# Patient Record
Sex: Male | Born: 1935 | Race: White | Hispanic: No | Smoking: Former smoker
Health system: Southern US, Community
[De-identification: ages and names within clinical notes are randomized; demographics above are authoritative.]

## PROBLEM LIST (undated history)

## (undated) DIAGNOSIS — E119 Type 2 diabetes mellitus without complications: Secondary | ICD-10-CM

## (undated) DIAGNOSIS — Z9289 Personal history of other medical treatment: Secondary | ICD-10-CM

## (undated) DIAGNOSIS — Z8719 Personal history of other diseases of the digestive system: Secondary | ICD-10-CM

## (undated) DIAGNOSIS — I2510 Atherosclerotic heart disease of native coronary artery without angina pectoris: Secondary | ICD-10-CM

## (undated) DIAGNOSIS — R011 Cardiac murmur, unspecified: Secondary | ICD-10-CM

## (undated) DIAGNOSIS — M545 Low back pain, unspecified: Secondary | ICD-10-CM

## (undated) DIAGNOSIS — M1990 Unspecified osteoarthritis, unspecified site: Secondary | ICD-10-CM

## (undated) DIAGNOSIS — J329 Chronic sinusitis, unspecified: Secondary | ICD-10-CM

## (undated) DIAGNOSIS — G4730 Sleep apnea, unspecified: Secondary | ICD-10-CM

## (undated) DIAGNOSIS — I10 Essential (primary) hypertension: Secondary | ICD-10-CM

## (undated) DIAGNOSIS — G8929 Other chronic pain: Secondary | ICD-10-CM

## (undated) DIAGNOSIS — N400 Enlarged prostate without lower urinary tract symptoms: Secondary | ICD-10-CM

## (undated) DIAGNOSIS — I2109 ST elevation (STEMI) myocardial infarction involving other coronary artery of anterior wall: Secondary | ICD-10-CM

## (undated) DIAGNOSIS — E785 Hyperlipidemia, unspecified: Secondary | ICD-10-CM

## (undated) DIAGNOSIS — I6529 Occlusion and stenosis of unspecified carotid artery: Secondary | ICD-10-CM

## (undated) HISTORY — DX: Atherosclerotic heart disease of native coronary artery without angina pectoris: I25.10

## (undated) HISTORY — DX: Chronic sinusitis, unspecified: J32.9

## (undated) HISTORY — PX: SKIN CANCER EXCISION: SHX779

## (undated) HISTORY — DX: ST elevation (STEMI) myocardial infarction involving other coronary artery of anterior wall: I21.09

## (undated) HISTORY — DX: Benign prostatic hyperplasia without lower urinary tract symptoms: N40.0

## (undated) HISTORY — DX: Essential (primary) hypertension: I10

## (undated) HISTORY — DX: Hyperlipidemia, unspecified: E78.5

---
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
## 1969-05-18 HISTORY — PX: CATARACT EXTRACTION, BILATERAL: SHX1313

## 1979-05-19 HISTORY — PX: CARPAL TUNNEL RELEASE: SHX101

## 1989-05-18 HISTORY — PX: TRANSURETHRAL RESECTION OF PROSTATE: SHX73

## 2000-09-17 DIAGNOSIS — I2109 ST elevation (STEMI) myocardial infarction involving other coronary artery of anterior wall: Secondary | ICD-10-CM

## 2000-09-17 HISTORY — DX: ST elevation (STEMI) myocardial infarction involving other coronary artery of anterior wall: I21.09

## 2000-09-17 HISTORY — PX: CORONARY ARTERY BYPASS GRAFT: SHX141

## 2001-09-04 ENCOUNTER — Inpatient Hospital Stay (HOSPITAL_COMMUNITY): Admission: AD | Admit: 2001-09-04 | Discharge: 2001-09-16 | Payer: Self-pay | Admitting: *Deleted

## 2001-09-08 HISTORY — PX: CARDIAC CATHETERIZATION: SHX172

## 2001-09-09 ENCOUNTER — Encounter: Payer: Self-pay | Admitting: Cardiology

## 2001-09-09 ENCOUNTER — Encounter: Payer: Self-pay | Admitting: Cardiothoracic Surgery

## 2001-09-10 ENCOUNTER — Encounter: Payer: Self-pay | Admitting: Cardiology

## 2001-09-11 ENCOUNTER — Encounter: Payer: Self-pay | Admitting: Cardiothoracic Surgery

## 2001-09-12 ENCOUNTER — Encounter: Payer: Self-pay | Admitting: Cardiothoracic Surgery

## 2001-09-13 ENCOUNTER — Encounter: Payer: Self-pay | Admitting: Cardiothoracic Surgery

## 2001-09-14 ENCOUNTER — Encounter: Payer: Self-pay | Admitting: Cardiothoracic Surgery

## 2008-10-18 HISTORY — PX: TRANSURETHRAL PROSTATECTOMY WITH GYRUS INSTRUMENTS: SHX6153

## 2008-10-19 ENCOUNTER — Ambulatory Visit (HOSPITAL_COMMUNITY): Admission: RE | Admit: 2008-10-19 | Discharge: 2008-10-20 | Payer: Self-pay | Admitting: Urology

## 2010-08-23 ENCOUNTER — Encounter
Admission: RE | Admit: 2010-08-23 | Discharge: 2010-08-23 | Payer: Self-pay | Source: Home / Self Care | Admitting: Cardiology

## 2010-08-23 ENCOUNTER — Ambulatory Visit: Payer: Self-pay | Admitting: Cardiology

## 2010-08-28 ENCOUNTER — Ambulatory Visit (HOSPITAL_COMMUNITY)
Admission: RE | Admit: 2010-08-28 | Discharge: 2010-08-29 | Payer: Self-pay | Source: Home / Self Care | Attending: Cardiovascular Disease | Admitting: Cardiovascular Disease

## 2010-08-28 HISTORY — PX: CORONARY ANGIOPLASTY WITH STENT PLACEMENT: SHX49

## 2010-09-05 ENCOUNTER — Ambulatory Visit: Payer: Self-pay | Admitting: Cardiology

## 2010-10-17 ENCOUNTER — Ambulatory Visit: Payer: Self-pay | Admitting: Cardiology

## 2010-11-27 LAB — CBC
MCH: 30.5 pg (ref 26.0–34.0)
MCHC: 34.2 g/dL (ref 30.0–36.0)
Platelets: 181 10*3/uL (ref 150–400)

## 2010-11-27 LAB — GLUCOSE, CAPILLARY
Glucose-Capillary: 106 mg/dL — ABNORMAL HIGH (ref 70–99)
Glucose-Capillary: 107 mg/dL — ABNORMAL HIGH (ref 70–99)
Glucose-Capillary: 150 mg/dL — ABNORMAL HIGH (ref 70–99)
Glucose-Capillary: 179 mg/dL — ABNORMAL HIGH (ref 70–99)

## 2010-11-27 LAB — BASIC METABOLIC PANEL
BUN: 14 mg/dL (ref 6–23)
CO2: 28 mEq/L (ref 19–32)
Calcium: 9.4 mg/dL (ref 8.4–10.5)
Creatinine, Ser: 0.55 mg/dL (ref 0.4–1.5)
Glucose, Bld: 130 mg/dL — ABNORMAL HIGH (ref 70–99)

## 2011-01-01 LAB — BASIC METABOLIC PANEL
BUN: 12 mg/dL (ref 6–23)
CO2: 28 mEq/L (ref 19–32)
Calcium: 9.5 mg/dL (ref 8.4–10.5)
GFR calc non Af Amer: >60 mL/min (ref >60)
Glucose, Bld: 138 mg/dL — ABNORMAL HIGH (ref 70–99)

## 2011-01-02 LAB — GLUCOSE, CAPILLARY
Glucose-Capillary: 123 mg/dL — ABNORMAL HIGH (ref 70–99)
Glucose-Capillary: 130 mg/dL — ABNORMAL HIGH (ref 70–99)
Glucose-Capillary: 150 mg/dL — ABNORMAL HIGH (ref 70–99)
Glucose-Capillary: 191 mg/dL — ABNORMAL HIGH (ref 70–99)

## 2011-01-30 NOTE — Op Note (Signed)
NAME:  Tommy Patterson, Tommy Patterson NO.:  [id number]   MEDICAL RECORD NO.:  [id number]          PATIENT TYPE:  AMB   LOCATION:  DAY                          FACILITY:  [facility]   PHYSICIAN:  Excell Seltzer. Annabell Howells, M.D.    DATE OF BIRTH:  04-25-1924   DATE OF PROCEDURE:  10/19/2008  DATE OF DISCHARGE:                               OPERATIVE REPORT   PROCEDURE:  Gyrus transurethral resection of the prostate.   PREOPERATIVE DIAGNOSIS:  Benign prostatic hypertrophy and bladder  obstruction.   POSTOPERATIVE DIAGNOSIS:  Benign prostatic hypertrophy and bladder  obstruction.   SURGEON:  Dr. Bjorn Pippin.   ANESTHESIA:  Spinal.   DRAINS:  23-French three-way Foley catheter.   COMPLICATIONS:  Bleeding that obscured visualization, requiring early  cessation of the procedure.   INDICATIONS:  Tommy Patterson is an 77 year old white male with BPH and  bladder obstruction with primarily irritative symptoms, but urodynamic  evidence of obstruction.  He had failed anticholinergic therapy it was  felt that TURP was indicated to relieve his outlet obstruction and  hopefully reduce his irritative symptoms.   FINDINGS/PROCEDURE:  The patient was given Cipro.  He was taken to the  operating room and spinal anesthetic was induced.  He was placed in  lithotomy position.  His perineum and genitalia were prepped with  Betadine solution.  He was draped in the usual sterile fashion.   Cystoscopy was performed using a 22-French scope and 12 and 70-degree  lenses.  Examination revealed a normal urethra.  The external sphincter  was intact.  Prostatic urethra was short with a tight bladder neck and  bilobar hyperplasia with some obstruction.  Examination of bladder  revealed mild to moderate trabeculation.  No tumors or stones were  noted.  Ureteral orifices were in their normal anatomic position.  The  left was slightly U-shaped, the right was unremarkable.   After thorough cystoscopy, the urethra was  calibrated to 32-French with  Sissy Hoff sounds and a 28-French continuous-flow resectoscope sheath was  inserted.  This was fitted with Wandra Scot handle, a Gyrus button  electrode and 12-degree lens.  Normal saline was used as the irrigant.  Once in position, the floor of the prostate was resected from bladder  neck out to the verumontanum.  A fairly generous groove in the floor was  created.  Some lateral tissue was resected as well.  However, before  extensive vaporization could be performed, visualization became poor and  it was felt to be related to some venous bleeding, possibly from the  initial dilation at the external sphincter.  I was reluctant to do  vigorous fulguration in the area of the sphincter for fear of  incontinence complications.  On two occasions a Foley catheter was  placed, the balloon was filled with 30 mL of sterile fluid and the  catheter was held on traction, first for 2 minutes, second for 5  minutes.  However, both of these maneuvers failed to stop the bleeding  sufficiently to resume resection.   After these efforts and, once again, failure to adequately visualize  the  prostatic urethra, because of this troublesome bleeding, I felt I needed  to stop the procedure.  The bleeding was aggravated by the fact that his  bladder capacity was quite small, so it was difficult to maintain  continuous irrigation.   At this point the 22 three-way Foley catheter was reinserted, balloon  was filled with 30 mL of sterile fluid and the catheter was placed on  traction.  The catheter was irrigated with clear return from the bladder  and minimal drainage of blood along the catheter at the urethral meatus.  The catheter was then placed to continuous irrigation and straight  drainage.  The drapes were removed.  The catheter was secured to the  patient's thigh on traction with a catheter strap.  He was taken down  from lithotomy position  and was moved to the recovery room in  stable  condition.      Excell Seltzer. Annabell Howells, M.D.  Electronically Signed     JJW/[id number]  D:  10/19/2008  T:  10/20/2008  Job:  [id number]   cc:   Peter M. Swazil[place], M.D.  Fax: [phone]   Donzetta Sprung  Fax: [phone]

## 2011-02-02 NOTE — Op Note (Signed)
[place]  Patient:    Tommy Patterson, Tommy Patterson Visit Number: [id number] MRN: [id number]          Service Type: MED Location: 2300 2312 01 Attending Physician:  Mikey Bussing Dictated by:   Mikey Bussing, M.D. Proc. Date: 09/11/01 Admit Date:  09/04/2001   CC:         CVTS Office  [facility]   Operative Report  OPERATION:  Coronary artery bypass grafting x 3 (left internal mammary artery to left anterior descending, saphenous vein graft to diagonal, saphenous vein graft to circumflex posterolateral).  PREOPERATIVE DIAGNOSES:  Acute myocardial infarction treated with thrombolytic therapy, 2-vessel coronary artery disease.  POSTOPERATIVE DIAGNOSES:  Acute myocardial infarction treated with thrombolytic therapy, 2-vessel coronary artery disease.  SURGEON:  Mikey Bussing, M.D.  ASSISTANT:  Loura Pardon, P.A.- C.  ANESTHESIA:  General.  INDICATIONS:  The patient is a 77 year old male, who presented with acute substernal chest pain early in the morning and presented to his [facility], where he was treated with TPA thrombolytic therapy with resolution of symptoms and EKG changes.  His CPK-MB was positive and he was transferred to [facility].  He was stabilized on heparin and nitroglycerin and underwent diagnostic cardiac catheterization by Dr. ______, which revealed severe 2-vessel LAD circumflex disease.  He was referred for surgical coronary revascularization.  Prior to the operation, I examined the patient in his intensive care unit room and reviewed the results of the cardiac catheterization with the patient and his family.  I discussed the indications and expected benefits of coronary bypass surgery.  I discussed the major aspects of the procedure, including the location of the surgical incisions, the use of the general anesthesia, and cardiopulmonary bypass, and the expected postoperative hospital recovery period.   I reviewed the risks associated with this operation to the patient, including the risk of MI, CVA, bleeding, infection, and death.  I reviewed the major alternatives to surgical therapy for treatment of coronary artery disease and expected the outcomes of those alternative therapies.  He understood these implications of the operation and agreed to proceed with the operation as planned under informed consent.  OPERATIVE FINDINGS:  The saphenous vein was harvested from his right leg.  The left leg had undergone previous vein stripping.  The mammary artery was a good vessel with excellent flow.  The LAD/diagonal had diffuse disease.  The right coronary had no significant disease.  PROCEDURE:  The patient was brought to the operating room and placed supine on the operating room table, where general anesthesia was induced under invasive hemodynamic monitoring.  The chest, abdomen and legs were prepped with Betadine and draped as a sterile field.  A sternal incision was made as the saphenous vein was harvested from the right leg.  The internal mammary artery was harvested as a pedicle graft from its origin at the subclavian vessels. Heparin was administered and the ACT was documented as being therapeutic.  The sternal retractor was placed and through purse-strings placed in the ascending aorta and right atrium, the patient was cannulated and placed on bypass and cooled to 32 degrees.  The coronaries were identified for grafting and the mammary artery and vein grafts were prepared for the distal anastomoses.  A cardioplegia cannula was placed.  The patient was cooled to 28 degrees.  As the aortic cross-clamp was applied, 650 cc of cold-blood cardioplegia was delivered to the aortic root with immediate cardioplegic arrest and septal  temperature dropping less than 12 degrees. Topical ice saline slush was used to augment myocardial preservation and a pericardial insulator pad was used to protect  the left phrenic nerve.  The distal coronary anastomoses were then performed.  The first distal anastomosis was to the diagonal.  This was a 1.5 mm vessel with a proximal 90% stenosis and a reverse saphenous vein was sewn end-to-side with a running 7-0 Prolene with good flow through the graft.  A second distal anastomosis was to the posterolateral branch of the circumflex.  This was a smaller 1.2 mm vessel on the lateral wall.  A reverse saphenous vein was sewn end-to-side with running 7-0 Prolene with good flow through the graft.  Cardioplegia was redosed.  A third distal anastomoses was to the mid LAD, which was a 1.8 mm vessel with proximal 90% stenosis.  The left internal mammary artery pedicle was brought through an opening created in the left lateral pericardium.  It was brought down on the LAD, and sewn end-to-side with a running 8-0 Prolene. There was excellent flow through the anastomosis with immediate rise in septal temperature after release of the pedicle clamp on the mammary artery.  The mammary pedicle was secured to the epicardium and the aortic cross-clamp was removed.  The heart resumed a spontaneous rhythm.  A partial occluding clamp was placed on the ascending aorta and two proximal vein anastomoses were placed using a 4.0 mm punch and running 6-0 Prolene.  The partial clamp was removed and the vein grafts were perfused.  Each had excellent flow and hemostasis was documented at the proximal and distal sites.  The patient was rewarmed and reperfused.  Temporary pacing wires were applied.  The lungs were re-expanded and the ventilator was resumed.  The patient was weaned from bypass without difficulty without inotropes.  Cardiac output and blood pressure were stable. Protamine was administered and the cannulae were removed.  The mediastinum was irrigated with warm antibiotic irrigation.  The leg incision was irrigated and  closed in a standard fashion.  The pericardium  was loosely reapproximated and two mediastinal and a left pleural chest tube were placed and brought out through separate incisions.  The sternum was reapproximated with interrupted steel wire.  The pectoralis fascia was closed with #1 Vicryl.  The subcutaneous and skin were closed with running Vicryl.  Sterile dressings were applied.  Total bypass time was 90 minutes with an aortic cross-clamp time of 35 minutes. Dictated by:   Mikey Bussing, M.D. Attending Physician:  Mikey Bussing DD:  09/12/01 TD:  09/12/01 Job: [id number]

## 2011-02-02 NOTE — H&P (Signed)
[place]  Patient:    Tommy Patterson, Tommy Patterson Visit Number: [id number] MRN: [id number]          Service Type: MED Location: MICU 2104 01 Attending Physician:  Mora Appl Dictated by:   Peter M. [place]il[place], M.D. Admit Date:  09/04/2001   CC:         Florene Glen, M.D.  in [place], [place].   History and Physical  HISTORY OF PRESENT ILLNESS:  Mr. Gorin is a 77 year old white male with a history of hypertension, noninsulin-dependent diabetes mellitus, and hyperlipidemia.  He awoke this morning at 4:30 a.m. with acute onset of substernal chest pain.  The pain was described as severe, crushing midsternal pain radiating to his left arm and back.  It was associated with diaphoresis and mild nausea.  He had no shortness of breath.  He called 911.  His ECG showed acute ST elevation anteriorly consistent with acute anterior injury.  He was transported to the [facility] Emergency Room where he received aspirin, IV morphine, IV nitroglycerin, and full dose TNKase.  Approximately 36 minutes after this infusion, his pain resolved.  He has been known to have some runs of idioventricular rhythm and intermittent left bundle branch block.  These have been asymptomatic.  PAST MEDICAL HISTORY:  Significant for diabetes mellitus type 2. Hypertension.  Hypercholesterolemia.  He has had previous cardiac catheterization in April of 1996 which showed only minor nonobstructive coronary disease.  He has had previous hernia surgery in 1960.  He has had removal of a spinal tumor in 1950.  He has had bilateral carpal tunnel surgery this past year and bilateral cataract surgery this year as well. He has a history of diverticulosis.  ALLERGIES:  He has no known allergies.  MEDICATIONS: 1. Amaryl 4 mg q.a.m. and 2 mg q.p.m. 2. Aspirin 81 mg q.d. 3. Lipitor 5 mg q.d. 4. Indapamide 2.5 mg q.d.  SOCIAL HISTORY:  The patient is married.  His wife has Alzheimers disease  and the patient is her primary caregiver.  He has three children.  Denies tobacco or alcohol use.  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  FAMILY HISTORY:  Father died of a myocardial infarction at age 65.  Mother died of gangrene and complication of diabetes.  One brother has hypertension and prostate cancer.  REVIEW OF SYSTEMS:  No history of TIA or stroke.  No history of bleeding problems.  No history of peptic ulcer disease.  Denies any kidney disease.  He has had no recent trauma or surgery within the past six weeks.  All other review of systems are negative.  PHYSICAL EXAMINATION:  GENERAL:  The patient is a well-developed white male in no apparent distress.  VITAL SIGNS:  Blood pressure is 115/70, pulse 75, normal sinus rhythm, respirations are 20.  HEENT:  Pupils are equal, round and reactive to light and accommodation. Extraocular movements are full.  Oropharynx is clear.  There is no bleeding from his mucus membranes.  NECK:  He has no JVD or bruits.  LUNGS:  Clear.  CARDIOVASCULAR:  Regular rate and rhythm without gallops, murmurs, rubs, or clicks.  ABDOMEN:  Soft and nontender.  There is no hepatosplenomegaly, masses, or bruits.  PULSES:  Femoral pulses are 2+ and symmetric.  Pedal pulses are 2+ on the right and 1+ on the left.  EXTREMITIES:  He has no edema.  NEUROLOGICAL:  He is alert and oriented x 4.  Cranial nerves II-XII intact. Grossly normal motor  and sensory exam.  SKIN:  Warm and dry.  LABORATORY DATA:  From [facility] at 6:30, white count 12,400, hemoglobin 15.7, hematocrit 45.1, platelets 284,000.  CK 81.  Sodium 139, potassium 3.3, chloride 104, CO2 27, BUN 16, creatinine 0.7, glucose of 217. Coagulases were normal.  ECG at 6:27 and 7:30 a.m., showed ST elevation in leads I, aVL, V2 through V5 consistent with acute anterior injury.  ECG at 10:17 here shows normal sinus rhythm.  His ST elevation is completely resolved.  There is a tiny Q-wave  in V2.  Otherwise normal ECG.  Chest x-ray from [facility] showed no active disease.  IMPRESSION: 1. Acute anterior myocardial infarction with evidence of reperfusion    postthrombolytic therapy. 2. Diabetes mellitus type 2. 3. Hypertension. 4. Hypercholesterolemia. 5. Family history of coronary disease.  PLAN:  We will admit to the ICU.  We will continue on IV heparin and nitroglycerin.  Continue with aspirin.  Add a beta blocker and ACE inhibitor. We will continue with his Amaryl and add sliding scale insulin.  Continue Lipitor.  We will monitor serial cardiac enzymes and ECG.  If the patient continues to do well, then plan elective cardiac catheterization on Monday.  If he has recurrent chest pain, he may need intervention sooner. Dictated by:   Peter M. [place], M.D. Attending Physician:  Meade Maw A DD:  09/04/01 TD:  09/04/01 Job: [id number]

## 2011-02-02 NOTE — Cardiovascular Report (Signed)
[place]. [facility]  Patient:    Tommy Patterson, Tommy Patterson Visit Number: [id number] MRN: [id number]          Service Type: MED Location: MICU 2104 01 Attending Physician:  Mora Appl Dictated by:   Tommy Patterson, M.D. Proc. Date: 09/08/01 Admit Date:  09/04/2001   CC:         Tommy Fearing A. Tommy Patterson, M.D., Garfield Cornea, M.D.   Cardiac Catheterization  PROCEDURE:  Cardiac catheterization.  CARDIOLOGIST:  Tommy Patterson, M.D.  INDICATION FOR PROCEDURE:  The patient is a 77 year old white male status post anterior myocardial infarction treated with thrombolytic therapy with successful reperfusion.  He has a history of diabetes and hyperlipidemia.  ACCESS:  Via the right femoral artery and vein using standard Seldinger technique.  EQUIPMENT:  A 6-French 4 cm right and left Judkins catheters, 6-French pigtail catheter, 6-French arterial sheath.  MEDICATIONS:  Local anesthesia with 1% Xylocaine.  CONTRAST: 200 cc of Omnipaque.  HEMODYNAMIC DATA:   Aortic pressure 115/47 with a mean of 73 mmHg.  Left ventricular pressure is 112 with EDP of 25 mmHg.  ANGIOGRAPHIC DATA:  Left coronary artery arises normally.  Left main coronary artery is very short with essentially separate ostia from the LAD and left circumflex coronary artery.  The left main is without significant disease.  Left anterior descending artery is a large vessel.  There is complex segmental 90% stenosis proximally.  The most severe portion of this lesion is proximal to the first diagonal and plaque extends well past the first diagonal branch which is a moderate size vessel.  There is also an 80% stenosis in the mid LAD.  The left circumflex coronary artery has an 80 to 90% stenosis in the mid vessel proximal to the terminal lateral branch.  The first obtuse marginal vessel bifurcates and has stenosis up to 30%.  The right coronary artery arises and distributes normally.   There is somewhat diffuse 30% narrowing with approximately 30% proximally.  LEFT VENTRICULAR ANGIOGRAPHY:  Left ventricular angiography performed in the RAO and LAO cranial views demonstrates normal left ventricular size.  There is mid anterior wall hypokinesia with overall well preserved left ventricular systolic function.  Ejection fraction is estimated at 55%.  FINAL INTERPRETATION: 1. Severe two-vessel obstructive atherosclerotic coronary artery disease. 2. Well preserved left ventricular function with mid anterior wall    hypokinesia.  PLAN:  Consider coronary artery bypass surgery. Dictated by:   Tommy Patterson, M.D. Attending Physician:  Mora Appl DD:  09/08/01 TD:  09/09/01 Job: [id number]

## 2011-02-02 NOTE — Consult Note (Signed)
[place]. [facility]  Patient:    Tommy Patterson, CANAL Visit Number: [id number] MRN: [id number]          Service Type: MED Location: MICU 2104 01 Attending Physician:  Meade Maw A Dictated by:   Mikey Bussing, M.D. Proc. Date: 09/08/01 Admit Date:  09/04/2001   CC:         CVTS Office  [facility]  Wende Crease, M.D., [place], [place]   Consultation Report  REASON FOR CONSULTATION:  Acute MI, severe two-vessel coronary artery disease.  PHYSICAL REQUESTING CONSULTATION:  Peter M. Swaziland, M.D.  PRIMARY CARE PHYSICIAN:  Dr. Doyne Keel, [place], [place].  CHIEF COMPLAINT:  Chest pain.  HISTORY OF PRESENT ILLNESS:  I was asked to see [name], a 77 year old type 2 diabetic, who was admitted to [facility] 09/04/01 after an acute anterior MI.  He presented to the [facility] Emergency Department early that morning with acute onset of substernal chest pain.  It was crushing and severe in nature, radiating to his back and left arm and shoulder.  It was associated with diaphoresis and nausea and shortness of breath.  This woke him from sleep.  He was evaluated after being transported by the emergency transport squad and found to have acute ST segment elevation anteriorly.  He was given aspirin, morphine, nitroglycerin, and a full dose of TNKase.  Thirty to forty minutes after the lytic therapy, his chest pain and EKG changes resolved.  He was maintained on IV heparin and nitroglycerin and transferred to [facility] where he was evaluated by Dr. Peter Swaziland.  Dr. Swaziland kept him on beta blocker, nitroglycerin, and heparin, and he remained stable without any bleeding complications or recurrent chest pain.  He underwent cardiac catheterization today which demonstrated high-grade 90-95% stenosis of the proximal LAD, diagonal, and 80% stenosis of the mid circumflex.  His anterior wall was mildly hypokinetic, and he had no significant  mitral regurgitation on LV-gram or 2-D echocardiogram.  The right coronary had no significant disease, and left ventricular and diastolic pressure was 18 mmHg. Because of his severe coronary disease and recent MI, he was referred for consideration of surgical coronary revascularization.  The patient remained stable without chest pain following his cardiac cath today.  He was examined in the intensive care unit following cath in the presence of his wife and family.  PAST MEDICAL HISTORY: 1. Type 2 diabetes mellitus. 2. Hypertension. 3. Hypercholesterolemia. 4. Surgical history positive for bilateral cataract, bilateral carpal tunnel,    spinal surgery for a tumor in 1950 and remote cardiac cath in 1996 which    showed mild disease.  ALLERGIES:  No known drug allergies.  MEDICATIONS: 1. Amaryl 4 mg p.o. q.a.m., 2 mg p.o. q.p.m. 2. Aspirin 81 mg q.d. 3. Lipitor 5 mg q.d. 4. Indapamide 2.5 mg daily.  SOCIAL HISTORY:  The patient is a retired Paramedic.  He walks approximately three miles daily with a group of other adults.  He denies smoking or alcohol use.  His wife has Alzheimers disease, and he is primary caretaker for his wife.  He has three children.  FAMILY HISTORY:  Father died of MI at age 56.  Mother died of complications of diabetes.  One brother has hypertension and prostate cancer.  REVIEW OF SYSTEMS:  The patient is left-hand-dominant.  He denies any constitutional symptoms of weight loss, fever, or night sweats.  He denies any vascular symptoms of TIA, CVA, mini-stroke, or claudication.  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  He states his left leg has no varicose veins.  He denies any bleeding, diathesis, or easy bruisability.  He denies GI problems with blood per rectum or peptic ulcer disease but does have a history of diverticular disease.  He denies genitourinary  problems of prostatism, frequency, or nocturia.  He denies kidney stone or hematuria.  He denies problems with significant arthritis or muscular or skeletal injuries. 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  He denies seizure, syncope, or concussion.  Review of systems is otherwise negative.  PHYSICAL EXAMINATION:  VITAL SIGNS:  He is 5 foot 6 inches and weighs 185 pounds.  Blood pressure is 125/70.  Heart rate is 64 and regular.  Respirations 18.  He is comfortable and pleasant at rest during the exam.  HEENT:  Normocephalic.  EOMs full.  Dentition under fair repair.  Pharynx is clear.  NECK:  Supple without JVD, thyromegaly, mass, or carotid bruit.  LYMPHATICS:  No palpable adenopathy of the cervical, supraclavicular, or axillary regions.  CHEST:  Without deformity with clear breath sounds bilaterally.  CARDIAC:  Regular rate and rhythm without S3 gallop, murmur, or rub.  ABDOMEN:  Soft, nontender, without pulsatile mass or abdominal bruit.  EXTREMITIES:  No clubbing, edema, or tenderness.  He has a compression dressing over the right groin.  VASCULAR:  2+ pulses bilaterally in the radial, femoral, and pedal areas. There is no venous insufficiency in the lower extremities.  I cannot determine where the site of his vein stripping was due to the dressings on his right groin.  RECTAL:  Deferred.  SKIN:  Warm, clear, and dry without rash or lesion.  NEUROLOGIC:  Alert and oriented x 3 with full motor function and without motor deficit.  He is restrained with his right leg at bed rest following cardiac cath.  LABORATORY DATA:  Hematocrit 37%, creatinine 0.8, glucose 140.  Vascular lab studies performed today show mild carotid artery disease without significant  stenosis.  His brachial pressures are equal bilaterally, and he has calcified tibial vessels with positive tibial pulses.  His chest x-ray report  is pending.  IMPRESSION AND RECOMMENDATIONS:  A 77 year old type 2 diabetic with severe left anterior descending diagonal disease and moderate circumflex disease, status post recent anterior myocardial infarction with significant residual stenosis of his left anterior descending.  He would benefit for surgical coronary revascularization of the left anterior descending, diagonal, and circumflex marginal with respect to relief of symptoms and preservation of ventricular function.  I discussed the major aspects of the coronary bypass operation with the patient and his wife including the alternatives to surgical therapy for treatment of his coronary disease and the risks of the operation. He agrees to proceed with coronary bypass surgery which will be scheduled for midday on December 26.  Thank you very much for this consultation. Dictated by:   Mikey Bussing, M.D. Attending Physician:  Mora Appl DD:  09/08/01 TD:  09/08/01 Job: [id number]

## 2011-02-02 NOTE — Discharge Summary (Signed)
[place]. [facility]  Patient:    Tommy Patterson, Tommy Patterson Visit Number: [id number] MRN: [id number]          Service Type: Attending:  Mikey Bussing, M.D. Dictated by:   Sherrie George, P.A. Adm. Date:  09/04/01 Disc. Date: 09/16/01   CC:         Peter M. Swaziland, M.D.  Norval Morton, M.D., [place], [place]   Referring Physician Discharge [place]  DATE OF BIRTH:  1923-12-16  ADMITTING DIAGNOSES: 1. Acute anterior myocardial infarction. 2. Adult onset diabetes mellitus, non-insulin-dependent, type 2. 3. Hypertension. 4. Hypercholesterolemia. 5. Family history of coronary artery disease. 6. Status post left veins stripping.  DISCHARGE DIAGNOSES: 1. Severe three-vessel coronary artery disease with ejection fraction of 55%,    status post acute anterior myocardial infarction. 2. Adult onset diabetes mellitus, non-insulin-dependent. 3. Hypertension. 4. Hypercholesterolemia. 5. Positive family history.  PROCEDURES: 1. Cardiac catheterization, 09/08/01, Dr. Elease Hashimoto. 2. Coronary artery bypass grafting x 3.  The left internal mammary artery to    the LAD; saphenous vein graft to diagonal, and saphenous vein graft to the    circumflex, 09/11/01.  BRIEF HISTORY:  The patient is a 77 year old white male, medical patient of Dr. Wende Crease in [place] and Dr. Peter Swaziland here in [place], who presented with acute onset of substernal chest pain around 3 a.m. the morning of admission.  It was described as severe to crushing mid sternal pain radiating to the left arm and back.  It was associated with diaphoresis, mild nausea.  He had no shortness of breath.  He called 911, and EKG showed ST elevation anteriorly consistent with acute anterior injury.  He was transferred to [facility] where he received aspirin, IV morphine, IV nitroglycerin, and a full dose of TNKase.  Approximately 36 minutes after the infusion, his pain resolved.  He has been known to  have some runs of ______ ventricular rhythm and intermittent left bundle-branch block which have been symptomatic.  He was subsequently transferred to [facility], and Dr. Swaziland for further treatment and follow-up as indicated.  PAST MEDICAL HISTORY: 1. Diabetes, type 2. 2. Hypertension. 3. Hypercholesterolemia. 4. He has high-dose a previous cardiac cath in April, which nonobstructive    coronary artery disease. 5. He has had prior hiatal hernia surgery in 1960. 6. He had a spinal tumor removed in 1950. 7. Bilateral carpal tunnel surgery this past year. 8. Bilateral cataract surgery this year. 9. History of diverticulosis.  ALLERGIES:  None known.  MEDICATIONS ON ADMISSION: 1. Amaryl 4 mg in the a.m., 2 mg in the p.m. 2. Aspirin 81 mg q.d. 3. Lipitor 5 mg q.d. 4. Indapamide 2.5 mg q.d.  SOCIAL HISTORY:  The patient is married.  His wife has Alzheimers disease, and he is the primary care giver.  He has three children.  He denies tobacco or alcohol use.  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  For further history and physical, please see the dictated note.  PHYSICAL EXAMINATION:  GENERAL:  This is a well-developed, well-nourished white male in no apparent distress.  VITAL SIGNS:  Blood pressure 115/50, pulse 70-75.  He is in sinus rhythm. Respirations 20.  For further H&P, see the dictated note.  HOSPITAL COURSE:  The patient was admitted with EKG changes which had resolved after treatment with the [facility].  He was transferred here and maintained on aspirin, heparin, nitroglycerin.  He remained stable over the next 48 hours and subsequently underwent cardiac  catheterization on 09/08/01.  This showed the left main was very short.  The LAD had a complex 90% proximal plaque, extending with a large D1, 80% mid LAD stenosis.  The left circumflex had an 80-90% mid vessel stenosis.  The RCA had a 40% proximal mid vessel stenosis also.  The LV showed mild anterior hypokinesis  with an ejection fraction calculated at 55%.  The patient was referred to CVTS and Dr. Kathlee Nations Trigt. After full evaluation, he recommended coronary artery bypass grafting.  The risks and benefits were discussed in detail, and informed preoperative consent was obtained.  Preoperative ABIs showed triphasic flow but vessels were noncompressible with pressures in the 206 range on the right and 210 on the left.  He remained stable.  He developed a hematoma of the right groin, but overall had no problems with his catheterization.  He remained stable.  He was ultimately taken to the operating room on 09/11/01, at which time he underwent coronary artery bypass grafting x 3 with the left internal mammary to the LAD, saphenous vein graft to diagonal, saphenous vein graft to the left circumflex. He tolerated the procedure well, came off bypass, and was transferred to the ICU in satisfactory condition.  He remained stable overnight.  First postoperative morning, he was stable.  He was extubated off a five liter nasal cannula.  White count was 16,000.  Hemoglobin was 39, hematocrit was stable. He made slow, steady progress.  He was transferred to the floor.  He has been started on phase 1 cardiac rehab and has continued to make good progress, and it was Dr. Garen Grams opinion, he would be ready for discharge either 09/16/01 or 09/17/01, depending on his gait stability and home arrangements.  The family is currently finalizing those arrangements.  We intend to discharge him home either today, 09/16/01, or in the a.m. 09/17/01.  His wounds are healing nicely.  He has a fair to moderate cough.  DISCHARGE MEDICATIONS: 1. Altace 5 mg q.d. 2. Amaryl 2 mg p.o. b.i.d. 3. Aspirin 325 mg p.o. q.d. 4. Potassium chloride 20 mEq q.d. 5. Lopressor 25 mg p.o. b.i.d. 6. Percocet 1-2 p.o. q.4h. p.r.n.   The patient will return to see Dr. Swaziland in two weeks.  He is to contact Dr. Doyne Keel for elevated sugars or  very low sugars.  He will see Dr. Donata Clay on Friday, October 10, 2001, at 9:30 a.m. with a chest x-ray from Dr. Illa Level office.  DISCHARGE ACTIVITY:  Light to moderate.  No lifting over 10 pounds.  No driving or strenuous activity.  CONDITION ON DISCHARGE:  Improved. Dictated by:   Sherrie George, P.A. Attending:  Mikey Bussing, M.D. DD:  09/16/01 TD:  09/16/01 Job: [id number]

## 2011-03-02 ENCOUNTER — Encounter: Payer: Self-pay | Admitting: Cardiology

## 2011-03-08 ENCOUNTER — Encounter: Payer: Self-pay | Admitting: Cardiology

## 2011-03-08 ENCOUNTER — Ambulatory Visit (INDEPENDENT_AMBULATORY_CARE_PROVIDER_SITE_OTHER): Payer: Medicare Other | Admitting: Cardiology

## 2011-03-08 DIAGNOSIS — I2510 Atherosclerotic heart disease of native coronary artery without angina pectoris: Secondary | ICD-10-CM

## 2011-03-08 DIAGNOSIS — I2109 ST elevation (STEMI) myocardial infarction involving other coronary artery of anterior wall: Secondary | ICD-10-CM | POA: Insufficient documentation

## 2011-03-08 DIAGNOSIS — E785 Hyperlipidemia, unspecified: Secondary | ICD-10-CM

## 2011-03-08 DIAGNOSIS — I10 Essential (primary) hypertension: Secondary | ICD-10-CM | POA: Insufficient documentation

## 2011-03-08 NOTE — Assessment & Plan Note (Signed)
He reports a blood work with his primary physician showed excellent control on simvastatin.

## 2011-03-08 NOTE — Assessment & Plan Note (Signed)
Blood pressure is under excellent control. 

## 2011-03-08 NOTE — Patient Instructions (Signed)
Continue your current medications.  Continue your exercise program.  We will see you back in 6 months.

## 2011-03-08 NOTE — Assessment & Plan Note (Signed)
He is asymptomatic at this time. He will continue with his current medical therapy and risk factor modification. I encouraged him to continue with his exercise program and we will see him back again in 6 months.

## 2011-03-08 NOTE — Progress Notes (Signed)
Tommy Patterson Date of Birth: [birth year]   History of Present Illness: Tommy Patterson is seen today for followup. He continues to do very well. He has had no recurrent anginal symptoms since his stent in December of 2011. He remains active and works out in [employer] regularly. He states he did feel poorly earlier in the year but since he has resumed his exercise program he is feeling better. He did have a tick embedded in his side and was on antibiotics 2 months ago.  Current Outpatient Prescriptions on File Prior to Visit  Medication Sig Dispense Refill  . aspirin 81 MG tablet Take 81 mg by mouth daily.        . Cholecalciferol (VITAMIN D3) 1000 UNITS CAPS Take by mouth daily.        . clopidogrel (PLAVIX) 75 MG tablet Take 75 mg by mouth daily.        . Cyanocobalamin (VITAMIN B12 PO) Take 2,500 mg by mouth daily.        . isosorbide mononitrate (IMDUR) 60 MG 24 hr tablet Take 60 mg by mouth daily.        Marland Kitchen losartan (COZAAR) 100 MG tablet Take 100 mg by mouth daily.        . metFORMIN (GLUCOPHAGE) 500 MG tablet Take 500 mg by mouth 2 (two) times daily with a meal.       . metoprolol succinate (TOPROL-XL) 25 MG 24 hr tablet Take 25 mg by mouth daily.        . Multiple Vitamin (MULTIVITAMIN) tablet Take 1 tablet by mouth 2 (two) times daily.        . polyethylene glycol (MIRALAX / GLYCOLAX) packet Take 17 g by mouth as needed.        . simvastatin (ZOCOR) 40 MG tablet Take 40 mg by mouth at bedtime.        Marland Kitchen Specialty Vitamins Products (ONE-A-DAY PROSTATE PO) Take by mouth daily.        Marland Kitchen DISCONTD: fish oil-omega-3 fatty acids 1000 MG capsule Take 2 g by mouth daily.        Marland Kitchen DISCONTD: levocarnitine (CARNITOR) 250 MG capsule Take 500 mg by mouth daily.          No Known Allergies  Past Medical History  Diagnosis Date  . Coronary artery disease     PCI of RCA 2011  . Acute MI, anterior wall   . Diabetes mellitus   . Hypertension   . Dyslipidemia   . Prostatism   . Sinusitis      Past Surgical History  Procedure Date  . Cardiac catheterization 08/28/2010  . Cardiac catheterization 09/08/2001    THERE IS MID ANTERIOR WALL HYPOKINESIA WITH OVERALL WELL PRESERVED LEFT VENTRICULAR SYSTOLIC FUNCTION. EF 55%  . Coronary artery bypass graft 2002  . Pci 2011    TO THE RCA    History  Smoking status  . Former Smoker  Smokeless tobacco  . Not on file    History  Alcohol Use: Not on file    Family History  Problem Relation Age of Onset  . Arrhythmia Mother   . Diabetes Mother   . Heart attack Father     Review of Systems: All other systems were reviewed and are negative.  Physical Exam: BP 128/58  Pulse 60  Wt 154 lb (69.854 kg) He is a pleasant elderly white male in no acute distress. His HEENT exam is unremarkable. He has no JVD or  bruits. His lungs are clear. Cardiac exam reveals a regular rate and rhythm with a grade 2/6 ejection murmur left sternal border. There is no gallop. Abdomen is soft nontender without masses or bruits. He has no edema. Pedal pulses are good. He is alert and oriented x3. Cranial nerves II through XII are intact. He does use a cane for walking. LABORATORY DATA:   Assessment / Plan:

## 2011-08-30 ENCOUNTER — Ambulatory Visit (INDEPENDENT_AMBULATORY_CARE_PROVIDER_SITE_OTHER): Payer: Medicare Other | Admitting: Cardiology

## 2011-08-30 ENCOUNTER — Encounter: Payer: Self-pay | Admitting: Cardiology

## 2011-08-30 VITALS — BP 140/46 | HR 47 | Ht 64.0 in | Wt 153.0 lb

## 2011-08-30 DIAGNOSIS — E785 Hyperlipidemia, unspecified: Secondary | ICD-10-CM

## 2011-08-30 DIAGNOSIS — I2510 Atherosclerotic heart disease of native coronary artery without angina pectoris: Secondary | ICD-10-CM

## 2011-08-30 DIAGNOSIS — I10 Essential (primary) hypertension: Secondary | ICD-10-CM

## 2011-08-30 MED ORDER — AMLODIPINE BESYLATE 2.5 MG PO TABS: 2.5000 mg | ORAL_TABLET | Freq: Every day | ORAL | Status: DC

## 2011-08-30 MED ORDER — METOPROLOL SUCCINATE ER 25 MG PO TB24: ORAL_TABLET | ORAL | Status: DC

## 2011-08-30 NOTE — Progress Notes (Signed)
Tommy Patterson Date of Birth: May 21, 1924   History of Present Illness: Tommy Patterson is seen today for followup. He continues to do very well. He has had no recurrent anginal symptoms since his stent in December of 2011. He remains active and works out in [employer] regularly. He is walking 6 miles per day. He does complain of some decreased energy. Blood pressure readings have been elevated in the 140 systolic range.  Current Outpatient Prescriptions on File Prior to Visit  Medication Sig Dispense Refill  . aspirin 81 MG tablet Take 81 mg by mouth daily.        . Cholecalciferol (VITAMIN D3) 1000 UNITS CAPS Take by mouth daily.        . clopidogrel (PLAVIX) 75 MG tablet Take 75 mg by mouth daily.        . Cyanocobalamin (VITAMIN B12 PO) Take 2,500 mg by mouth daily.        . isosorbide mononitrate (IMDUR) 60 MG 24 hr tablet Take 60 mg by mouth daily.        Marland Kitchen losartan (COZAAR) 100 MG tablet Take 100 mg by mouth daily.        . metFORMIN (GLUCOPHAGE) 500 MG tablet Take 500 mg by mouth 2 (two) times daily with a meal.       . Multiple Vitamin (MULTIVITAMIN) tablet Take 1 tablet by mouth 2 (two) times daily.        . polyethylene glycol (MIRALAX / GLYCOLAX) packet Take 17 g by mouth as needed.        . simvastatin (ZOCOR) 40 MG tablet Take 40 mg by mouth at bedtime.        Marland Kitchen Specialty Vitamins Products (ONE-A-DAY PROSTATE PO) Take by mouth daily.        Marland Kitchen DISCONTD: metoprolol succinate (TOPROL-XL) 25 MG 24 hr tablet Take 25 mg by mouth daily.          No Known Allergies  Past Medical History  Diagnosis Date  . Coronary artery disease     PCI of RCA 2011  . Acute MI, anterior wall     remote  . Diabetes mellitus   . Hypertension   . Dyslipidemia   . Prostatism   . Sinusitis     Past Surgical History  Procedure Date  . Cardiac catheterization 08/28/2010  . Cardiac catheterization 09/08/2001    THERE IS MID ANTERIOR WALL HYPOKINESIA WITH OVERALL WELL PRESERVED LEFT VENTRICULAR  SYSTOLIC FUNCTION. EF 55%  . Coronary artery bypass graft 2002  . Pci 2011    TO THE RCA    History  Smoking status  . Former Smoker  Smokeless tobacco  . Not on file    History  Alcohol Use: Not on file    Family History  Problem Relation Age of Onset  . Arrhythmia Mother   . Diabetes Mother   . Heart attack Father     Review of Systems: He complained of frequent nocturia 8-9 times a night. He is taking a prostate supplement and this has improved to 2 times per night. All other systems were reviewed and are negative.  Physical Exam: BP 140/46  Pulse 47  Ht 5\' 4"  (1.626 m)  Wt 153 lb (69.4 kg)  BMI 26.26 kg/m2 He is a pleasant elderly white male in no acute distress. His HEENT exam is unremarkable. He has no JVD or bruits. His lungs are clear. Cardiac exam reveals a regular rate and rhythm with a grade  2/6 ejection murmur left sternal border. There is no gallop. Abdomen is soft nontender without masses or bruits. He has no edema. Pedal pulses are good. He is alert and oriented x3. Cranial nerves II through XII are intact. He does use a cane for walking. LABORATORY DATA: ECG today demonstrates sinus bradycardia with a rate of 48 beats per minute and an incomplete right bundle branch block.  Assessment / Plan:

## 2011-08-30 NOTE — Patient Instructions (Addendum)
Reduce metoprolol to 12.5 mg daily.  We will add amlodipine 2.5 mg daily for blood pressure.  I will see you again in 6 months.  You may stop Plavix.

## 2011-08-30 NOTE — Assessment & Plan Note (Signed)
Blood pressure is mildly elevated. We will add Norvasc 2.5 mg daily. Because of his marked bradycardia we will reduce his metoprolol to 12.5 mg daily.

## 2011-08-30 NOTE — Assessment & Plan Note (Signed)
He has had no recurrent angina since we stented his right coronary in December of 2011. We will stop his Plavix at this time. He'll remain on aspirin. Continue risk factor modification.

## 2011-09-24 ENCOUNTER — Other Ambulatory Visit: Payer: Self-pay | Admitting: *Deleted

## 2011-09-24 MED ORDER — AMLODIPINE BESYLATE 2.5 MG PO TABS: 2.5000 mg | ORAL_TABLET | Freq: Every day | ORAL | Status: DC

## 2012-06-27 ENCOUNTER — Telehealth: Payer: Self-pay

## 2012-06-27 NOTE — Telephone Encounter (Signed)
Received letter from patient needing refill for amlodipine.[facility] prescription for amlodipine was signed by Dr.[name] and faxed back to fax#[phone].

## 2013-06-08 ENCOUNTER — Ambulatory Visit (INDEPENDENT_AMBULATORY_CARE_PROVIDER_SITE_OTHER): Payer: Medicare Other | Admitting: Nurse Practitioner

## 2013-06-08 ENCOUNTER — Encounter: Payer: Self-pay | Admitting: Nurse Practitioner

## 2013-06-08 VITALS — BP 150/60 | HR 46 | Ht 60.0 in | Wt 156.4 lb

## 2013-06-08 DIAGNOSIS — I2510 Atherosclerotic heart disease of native coronary artery without angina pectoris: Secondary | ICD-10-CM

## 2013-06-08 DIAGNOSIS — R42 Dizziness and giddiness: Secondary | ICD-10-CM

## 2013-06-08 DIAGNOSIS — I498 Other specified cardiac arrhythmias: Secondary | ICD-10-CM

## 2013-06-08 DIAGNOSIS — R001 Bradycardia, unspecified: Secondary | ICD-10-CM

## 2013-06-08 LAB — CBC WITH DIFFERENTIAL/PLATELET
Basophils Absolute: 0 10*3/uL (ref 0.0–0.1)
Basophils Relative: 0.4 % (ref 0.0–3.0)
Eosinophils Absolute: 0.1 10*3/uL (ref 0.0–0.7)
Eosinophils Relative: 1.1 % (ref 0.0–5.0)
HCT: 37.2 % — ABNORMAL LOW (ref 39.0–52.0)
Hemoglobin: 12.8 g/dL — ABNORMAL LOW (ref 13.0–17.0)
Lymphocytes Relative: 10.6 % — ABNORMAL LOW (ref 12.0–46.0)
Lymphs Abs: 0.8 10*3/uL (ref 0.7–4.0)
MCHC: 34.3 g/dL (ref 30.0–36.0)
MCV: 90.7 fl (ref 78.0–100.0)
Monocytes Absolute: 0.7 10*3/uL (ref 0.1–1.0)
Monocytes Relative: 8.3 % (ref 3.0–12.0)
Neutro Abs: 6.2 10*3/uL (ref 1.4–7.7)
Neutrophils Relative %: 79.6 % — ABNORMAL HIGH (ref 43.0–77.0)
Platelets: 192 10*3/uL (ref 150.0–400.0)
RBC: 4.1 Mil/uL — ABNORMAL LOW (ref 4.22–5.81)
RDW: 13.1 % (ref 11.5–14.6)
WBC: 7.9 10*3/uL (ref 4.5–10.5)

## 2013-06-08 LAB — BASIC METABOLIC PANEL
BUN: 20 mg/dL (ref 6–23)
CO2: 28 mEq/L (ref 19–32)
Calcium: 9.4 mg/dL (ref 8.4–10.5)
Chloride: 104 mEq/L (ref 96–112)
Creatinine, Ser: 0.6 mg/dL (ref 0.4–1.5)
GFR: 140.92 mL/min (ref >60.00)
Glucose, Bld: 118 mg/dL — ABNORMAL HIGH (ref 70–99)
Potassium: 4 mEq/L (ref 3.5–5.1)
Sodium: 138 mEq/L (ref 135–145)

## 2013-06-08 LAB — LIPID PANEL
Cholesterol: 164 mg/dL (ref 0–200)
HDL: 37.9 mg/dL — ABNORMAL LOW (ref >39.00)
LDL Cholesterol: 112 mg/dL — ABNORMAL HIGH (ref 0–99)
Total CHOL/HDL Ratio: 4
Triglycerides: 69 mg/dL (ref 0.0–149.0)
VLDL: 13.8 mg/dL (ref 0.0–40.0)

## 2013-06-08 LAB — HEPATIC FUNCTION PANEL
ALT: 18 U/L (ref 0–53)
AST: 26 U/L (ref 0–37)
Albumin: 4.1 g/dL (ref 3.5–5.2)
Alkaline Phosphatase: 50 U/L (ref 39–117)
Bilirubin, Direct: 0.1 mg/dL (ref 0.0–0.3)
Total Bilirubin: 0.7 mg/dL (ref 0.3–1.2)
Total Protein: 6.8 g/dL (ref 6.0–8.3)

## 2013-06-08 MED ORDER — AMLODIPINE BESYLATE 5 MG PO TABS: 5.0000 mg | ORAL_TABLET | Freq: Every day | ORAL | Status: DC

## 2013-06-08 MED ORDER — SIMVASTATIN 40 MG PO TABS: 20.0000 mg | ORAL_TABLET | Freq: Every day | ORAL | Status: DC
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
## 2013-06-08 NOTE — Patient Instructions (Addendum)
Stay on your current medicines but I am stopping the metoprolol and increasing your Norvasc to 5 mg a day (You can take 2 of your 2.5 mg tablets to equal this dose - I have sent off the 5 mg tablet to your mail order)  I am cutting the Simvastatin back to a half a tablet each day.   We need to check labs today  Keep active  See Dr. Swaziland in 3 months with an EKG  Call the [facility] office at [phone] if you have any questions, problems or concerns.

## 2013-07-10 ENCOUNTER — Telehealth: Payer: Self-pay | Admitting: Cardiology

## 2013-07-10 NOTE — Telephone Encounter (Signed)
New problem    Pt called for his appt. From letter.   I need help finding and appt. 08/2013   [name]

## 2013-07-10 NOTE — Telephone Encounter (Signed)
Returned call to patient appointment scheduled with Dr.[name] 08/24/13 at 11:45 am.

## 2013-08-24 ENCOUNTER — Ambulatory Visit (INDEPENDENT_AMBULATORY_CARE_PROVIDER_SITE_OTHER): Payer: Medicare Other | Admitting: Cardiology

## 2013-08-24 ENCOUNTER — Encounter: Payer: Self-pay | Admitting: Cardiology

## 2013-08-24 VITALS — BP 140/68 | HR 53 | Ht 60.0 in | Wt 156.0 lb

## 2013-08-24 DIAGNOSIS — E785 Hyperlipidemia, unspecified: Secondary | ICD-10-CM

## 2013-08-24 DIAGNOSIS — I2510 Atherosclerotic heart disease of native coronary artery without angina pectoris: Secondary | ICD-10-CM

## 2013-08-24 DIAGNOSIS — I10 Essential (primary) hypertension: Secondary | ICD-10-CM

## 2013-08-24 DIAGNOSIS — R001 Bradycardia, unspecified: Secondary | ICD-10-CM | POA: Insufficient documentation

## 2013-08-24 DIAGNOSIS — I498 Other specified cardiac arrhythmias: Secondary | ICD-10-CM

## 2013-08-24 NOTE — Patient Instructions (Signed)
Continue your current medication  I will see you in 6 months.   

## 2013-08-24 NOTE — Progress Notes (Signed)
Tommy Patterson Date of Birth: 03/05/1924 Medical Record #[id number]  History of Present Illness: Tommy Patterson is seen back today for a follow up visit.He has known CAD with prior PCI/stent in December of 2011. Other issues include DM, HTN, and HLD.  He was seen in September and noted to have significant bradycardia. Toprol was discontinued. Norvasc added for BP control. Zocor dose reduced. Feels well today. No dizzyness or SOB. No chest pain.    Current Outpatient Prescriptions  Medication Sig Dispense Refill  . amLODipine (NORVASC) 5 MG tablet Take 1 tablet (5 mg total) by mouth daily.  180 tablet  3  . aspirin 81 MG tablet Take 81 mg by mouth daily.        . Carboxymethylcellul-Glycerin (REFRESH OPTIVE OP) Apply to eye 2 (two) times daily.      . Cholecalciferol (VITAMIN D) 2000 UNITS CAPS Take 1 capsule by mouth daily.      . Coenzyme Q10 (COQ-10 PO) Take 1 capsule by mouth daily.      . isosorbide mononitrate (IMDUR) 60 MG 24 hr tablet Take 60 mg by mouth daily.        Marland Kitchen losartan (COZAAR) 100 MG tablet Take 100 mg by mouth daily.        Marland Kitchen MAGNESIUM CITRATE PO Take by mouth daily.      . Multiple Vitamins-Minerals (VISION PLUS) CAPS Take by mouth daily.      . nitroGLYCERIN (NITROSTAT) 0.4 MG SL tablet Place 0.4 mg under the tongue every 5 (five) minutes as needed.        . polyethylene glycol (MIRALAX / GLYCOLAX) packet Take 17 g by mouth as needed.        . simvastatin (ZOCOR) 40 MG tablet Take 0.5 tablets (20 mg total) by mouth at bedtime.  30 tablet    . Specialty Vitamins Products (ONE-A-DAY PROSTATE PO) Take 3 capsules by mouth daily.        No current facility-administered medications for this visit.    No Known Allergies  Past Medical History  Diagnosis Date  . Coronary artery disease     PCI of RCA 2011  . Acute MI, anterior wall     remote  . Diabetes mellitus   . Hypertension   . Dyslipidemia   . Prostatism   . Sinusitis     Past Surgical History  Procedure  Laterality Date  . Cardiac catheterization  08/28/2010  . Cardiac catheterization  09/08/2001    THERE IS MID ANTERIOR WALL HYPOKINESIA WITH OVERALL WELL PRESERVED LEFT VENTRICULAR SYSTOLIC FUNCTION. EF 55%  . Coronary artery bypass graft  2002  . Pci  2011    TO THE RCA    History  Smoking status  . Former Smoker  Smokeless tobacco  . Not on file    History  Alcohol Use No    Family History  Problem Relation Age of Onset  . Arrhythmia Mother   . Diabetes Mother   . Heart attack Father     Review of Systems: The review of systems is per the HPI.  All other systems were reviewed and are negative.  Physical Exam: BP 140/68  Pulse 53  Ht 5' (1.524 m)  Wt 156 lb (70.761 kg)  BMI 30.47 kg/m2 Patient is very pleasant and in no acute distress. Hard of hearing. Skin is warm and dry. Color is normal.  HEENT is unremarkable. Normocephalic/atraumatic. PERRL. Sclera are nonicteric. Neck is supple. No masses. No JVD. Lungs  are clear. Cardiac exam shows a regular rate and rhythm. Soft murmur noted.  Abdomen is soft. Extremities are without edema. Gait and ROM are intact. No gross neurologic deficits noted.  LABORATORY DATA:   Lab Results  Component Value Date   WBC 7.9 06/08/2013   HGB 12.8* 06/08/2013   HCT 37.2* 06/08/2013   PLT 192.0 06/08/2013   GLUCOSE 118* 06/08/2013   CHOL 164 06/08/2013   TRIG 69.0 06/08/2013   HDL 37.90* 06/08/2013   LDLCALC 112* 06/08/2013   ALT 18 06/08/2013   AST 26 06/08/2013   NA 138 06/08/2013   K 4.0 06/08/2013   CL 104 06/08/2013   CREATININE 0.6 06/08/2013   BUN 20 06/08/2013   CO2 28 06/08/2013   Ecg shows sinus bradycardia with rate 53 bpm, RBBB  Assessment / Plan: 1. CAD - stable symptoms - would continue with medical management and use NTG as needed. He knows to let us know if symptoms worsen.  2. HTN - BP controlled on Norvasc.  3. HLD - Cut Zocor back due to interaction with Norvasc. Will have follow up labs with primary MD  4.  Bradycardia - improved with stopping Toprol. Avoid any rate slowing meds.

## 2014-02-16 ENCOUNTER — Ambulatory Visit (INDEPENDENT_AMBULATORY_CARE_PROVIDER_SITE_OTHER): Payer: Medicare Other | Admitting: Cardiology

## 2014-02-16 ENCOUNTER — Encounter: Payer: Self-pay | Admitting: Cardiology

## 2014-02-16 VITALS — BP 152/60 | HR 68 | Ht 60.0 in | Wt 158.0 lb

## 2014-02-16 DIAGNOSIS — I2510 Atherosclerotic heart disease of native coronary artery without angina pectoris: Secondary | ICD-10-CM

## 2014-02-16 DIAGNOSIS — E785 Hyperlipidemia, unspecified: Secondary | ICD-10-CM

## 2014-02-16 DIAGNOSIS — I10 Essential (primary) hypertension: Secondary | ICD-10-CM

## 2014-02-16 MED ORDER — HYDROCHLOROTHIAZIDE 12.5 MG PO CAPS: 12.5000 mg | ORAL_CAPSULE | Freq: Every day | ORAL | Status: DC

## 2014-02-16 NOTE — Progress Notes (Signed)
Ernest HaberJohn Oliver Devito Date of Birth: 23-Nov-1923 Medical Record #[id number]  History of Present Illness: [name] is seen back today for a follow up visit.He has known CAD with prior PCI/stent in December of 2011. Other issues include DM, HTN, and HLD.  He is intolerant of beta blockers due to bradycardia. He is on statin therapy. He states he generally doesn't feel well. No dizzyness or SOB. No chest pain. Blood sugars are running higher. States he has had a sinus infection for the past year. He does have glaucoma. Tries to eat right but eats out a fair amount.   Current Outpatient Prescriptions  Medication Sig Dispense Refill  . amLODipine (NORVASC) 5 MG tablet Take 1 tablet (5 mg total) by mouth daily.  180 tablet  3  . aspirin 81 MG tablet Take 81 mg by mouth daily.        . Bilberry, Vaccinium myrtillus, (BILBERRY PO) Take 1 tablet by mouth 2 (two) times daily.      . Carboxymethylcellul-Glycerin (REFRESH OPTIVE OP) Apply to eye 2 (two) times daily.      . Cholecalciferol (VITAMIN D) 2000 UNITS CAPS Take 1 capsule by mouth daily.      . Coenzyme Q10 (COQ-10 PO) Take 200 mg by mouth daily.       . isosorbide mononitrate (IMDUR) 60 MG 24 hr tablet Take 60 mg by mouth daily.        Marland Kitchen. losartan (COZAAR) 100 MG tablet Take 100 mg by mouth at bedtime.       Marland Kitchen. MAGNESIUM CITRATE PO Take by mouth daily.      . Misc Natural Products (TOTAL MEMORY & FOCUS FORMULA PO) Take 2 capsules by mouth daily with lunch.      . Multiple Vitamins-Minerals (VISION PLUS) CAPS Take by mouth daily.      . nitroGLYCERIN (NITROSTAT) 0.4 MG SL tablet Place 0.4 mg under the tongue every 5 (five) minutes as needed.        Marland Kitchen. OVER THE COUNTER MEDICATION Clear ear complex vitamin . 1 capsule at noon.      Marland Kitchen. OVER THE COUNTER MEDICATION Sugar equilibrium  1 capsule twice daily ]      . polyethylene glycol (MIRALAX / GLYCOLAX) packet Take 17 g by mouth as needed.        . simvastatin (ZOCOR) 40 MG tablet Take 0.5 tablets (20  mg total) by mouth at bedtime.  30 tablet    . Specialty Vitamins Products (ONE-A-DAY PROSTATE PO) Take 3 capsules by mouth daily.       . hydrochlorothiazide (MICROZIDE) 12.5 MG capsule Take 1 capsule (12.5 mg total) by mouth daily.  90 capsule  3   No current facility-administered medications for this visit.    No Known Allergies  Past Medical History  Diagnosis Date  . Coronary artery disease     PCI of RCA 2011  . Acute MI, anterior wall     remote  . Diabetes mellitus   . Hypertension   . Dyslipidemia   . Prostatism   . Sinusitis     Past Surgical History  Procedure Laterality Date  . Cardiac catheterization  08/28/2010  . Cardiac catheterization  09/08/2001    THERE IS MID ANTERIOR WALL HYPOKINESIA WITH OVERALL WELL PRESERVED LEFT VENTRICULAR SYSTOLIC FUNCTION. EF 55%  . Coronary artery bypass graft  2002  . Pci  2011    TO THE RCA    History  Smoking status  . Former  Smoker  Smokeless tobacco  . Not on file    History  Alcohol Use No    Family History  Problem Relation Age of Onset  . Arrhythmia Mother   . Diabetes Mother   . Heart attack Father     Review of Systems: The review of systems is per the HPI.  All other systems were reviewed and are negative.  Physical Exam: BP 152/60  Pulse 68  Ht 5' (1.524 m)  Wt 158 lb (71.668 kg)  BMI 30.86 kg/m2 Patient is very pleasant and in no acute distress. Hard of hearing. Skin is warm and dry. Color is normal.  HEENT is unremarkable. Normocephalic/atraumatic. PERRL. Sclera are nonicteric. Neck is supple. No masses. No JVD. Lungs are clear. Cardiac exam shows a regular rate and rhythm. Soft murmur noted.  Abdomen is soft. Extremities are without edema. Gait and ROM are intact. No gross neurologic deficits noted.  LABORATORY DATA:   Lab Results  Component Value Date   WBC 7.9 06/08/2013   HGB 12.8* 06/08/2013   HCT 37.2* 06/08/2013   PLT 192.0 06/08/2013   GLUCOSE 118* 06/08/2013   CHOL 164 06/08/2013    TRIG 69.0 06/08/2013   HDL 37.90* 06/08/2013   LDLCALC 112* 06/08/2013   ALT 18 06/08/2013   AST 26 06/08/2013   NA 138 06/08/2013   K 4.0 06/08/2013   CL 104 06/08/2013   CREATININE 0.6 06/08/2013   BUN 20 06/08/2013   CO2 28 06/08/2013    Assessment / Plan: 1. CAD - stable symptoms - would continue with medical management and use NTG as needed. He knows to let us know if symptoms worsen.  2. HTN - BP higher today. Will add HCTZ 12.5 mg daily.   3. HLD - On lower dose of Zocor with Norvasc. Will have follow up labs with primary MD  4. Bradycardia - improved with stopping Toprol. Avoid any rate slowing meds.  5. DM type 2. Follow up with Dr. Reuel Boom.

## 2014-02-16 NOTE — Patient Instructions (Signed)
Continue your current therapy  We will add HCTZ 12.5 mg daily for your blood pressure  I will see you in 6 months.

## 2014-08-10 ENCOUNTER — Ambulatory Visit (INDEPENDENT_AMBULATORY_CARE_PROVIDER_SITE_OTHER): Payer: Medicare Other | Admitting: Cardiology

## 2014-08-10 ENCOUNTER — Other Ambulatory Visit: Payer: Self-pay | Admitting: Cardiology

## 2014-08-10 ENCOUNTER — Encounter: Payer: Self-pay | Admitting: Cardiology

## 2014-08-10 VITALS — BP 124/60 | HR 62 | Ht 64.0 in | Wt 161.6 lb

## 2014-08-10 DIAGNOSIS — IMO0002 Reserved for concepts with insufficient information to code with codable children: Secondary | ICD-10-CM

## 2014-08-10 DIAGNOSIS — I2510 Atherosclerotic heart disease of native coronary artery without angina pectoris: Secondary | ICD-10-CM

## 2014-08-10 DIAGNOSIS — E1165 Type 2 diabetes mellitus with hyperglycemia: Secondary | ICD-10-CM

## 2014-08-10 DIAGNOSIS — I25118 Atherosclerotic heart disease of native coronary artery with other forms of angina pectoris: Secondary | ICD-10-CM

## 2014-08-10 DIAGNOSIS — I779 Disorder of arteries and arterioles, unspecified: Secondary | ICD-10-CM

## 2014-08-10 DIAGNOSIS — I10 Essential (primary) hypertension: Secondary | ICD-10-CM

## 2014-08-10 DIAGNOSIS — E785 Hyperlipidemia, unspecified: Secondary | ICD-10-CM

## 2014-08-10 DIAGNOSIS — I200 Unstable angina: Secondary | ICD-10-CM

## 2014-08-10 DIAGNOSIS — I739 Peripheral vascular disease, unspecified: Secondary | ICD-10-CM

## 2014-08-10 LAB — HEPATIC FUNCTION PANEL
ALBUMIN: 4.2 g/dL (ref 3.5–5.2)
ALT: 21 U/L (ref 0–53)
AST: 27 U/L (ref 0–37)
Alkaline Phosphatase: 61 U/L (ref 39–117)
BILIRUBIN TOTAL: 0.9 mg/dL (ref 0.2–1.2)
Bilirubin, Direct: 0.2 mg/dL (ref 0.0–0.3)
Indirect Bilirubin: 0.7 mg/dL (ref 0.2–1.2)
Total Protein: 6.6 g/dL (ref 6.0–8.3)

## 2014-08-10 LAB — HEMOGLOBIN A1C
HEMOGLOBIN A1C: 7.8 % — AB (ref <5.7)
Mean Plasma Glucose: 177 mg/dL — ABNORMAL HIGH (ref <117)

## 2014-08-10 LAB — CBC WITH DIFFERENTIAL/PLATELET
BASOS PCT: 1 % (ref 0–1)
Basophils Absolute: 0.1 10*3/uL (ref 0.0–0.1)
EOS ABS: 0.1 10*3/uL (ref 0.0–0.7)
Eosinophils Relative: 1 % (ref 0–5)
HCT: 37.9 % — ABNORMAL LOW (ref 39.0–52.0)
Hemoglobin: 13 g/dL (ref 13.0–17.0)
LYMPHS ABS: 0.9 10*3/uL (ref 0.7–4.0)
Lymphocytes Relative: 14 % (ref 12–46)
MCH: 30.5 pg (ref 26.0–34.0)
MCHC: 34.3 g/dL (ref 30.0–36.0)
MCV: 89 fL (ref 78.0–100.0)
MPV: 10 fL (ref 9.4–12.4)
Monocytes Absolute: 0.5 10*3/uL (ref 0.1–1.0)
Monocytes Relative: 8 % (ref 3–12)
Neutro Abs: 4.9 10*3/uL (ref 1.7–7.7)
Neutrophils Relative %: 76 % (ref 43–77)
PLATELETS: 187 10*3/uL (ref 150–400)
RBC: 4.26 MIL/uL (ref 4.22–5.81)
RDW: 13.1 % (ref 11.5–15.5)
WBC: 6.5 10*3/uL (ref 4.0–10.5)

## 2014-08-10 LAB — BASIC METABOLIC PANEL
BUN: 26 mg/dL — ABNORMAL HIGH (ref 6–23)
CHLORIDE: 102 meq/L (ref 96–112)
CO2: 27 mEq/L (ref 19–32)
Calcium: 9.4 mg/dL (ref 8.4–10.5)
Creat: 0.67 mg/dL (ref 0.50–1.35)
Glucose, Bld: 167 mg/dL — ABNORMAL HIGH (ref 70–99)
Potassium: 4.2 mEq/L (ref 3.5–5.3)
Sodium: 138 mEq/L (ref 135–145)

## 2014-08-10 LAB — LIPID PANEL
CHOL/HDL RATIO: 4.1 ratio
Cholesterol: 171 mg/dL (ref 0–200)
HDL: 42 mg/dL (ref >39)
LDL CALC: 114 mg/dL — AB (ref 0–99)
Triglycerides: 74 mg/dL (ref <150)
VLDL: 15 mg/dL (ref 0–40)

## 2014-08-10 NOTE — Patient Instructions (Signed)
We will [facility] X[facility]  We will schedule you for a cardiac cath with possible intervention.  We will check blood work today.

## 2014-08-10 NOTE — Progress Notes (Signed)
 Tommy Patterson Date of Birth: 03/23/1924 Medical Record #[id number]  History of Present Illness: Tommy Patterson is seen back today for follow up CAD.He has known CAD s/p CABG in 2002 and with prior PCI/stent of the RCA in December of 2011 with DES. Other issues include DM, HTN, and HLD. He is intolerant of beta blockers due to bradycardia. He is on statin therapy. On follow up today he reports increased anginal symptoms over the past 4-5 months. He develops chest pain with exertion and has to take sl Ntg to continue. This has worsened and is now class 3. He still tries to stay active doing his own housework and yardwork but has been very limited by chest pain. He does note some vertigo in August and had a CT of the head and Neck at [facility] which apparently showed some carotid disease. He reports he was taken off metformin and his sugars are not doing as well. BS now 189.    Current Outpatient Prescriptions  Medication Sig Dispense Refill  . amLODipine (NORVASC) 5 MG tablet Take 1 tablet (5 mg total) by mouth daily. 180 tablet 3  . aspirin 81 MG tablet Take 81 mg by mouth daily.      . Bilberry, Vaccinium myrtillus, (BILBERRY PO) Take 1 tablet by mouth 2 (two) times daily.    . Carboxymethylcellul-Glycerin (REFRESH OPTIVE OP) Apply to eye 2 (two) times daily.    . Cholecalciferol (VITAMIN D) 2000 UNITS CAPS Take 1 capsule by mouth daily.    . Coenzyme Q10 (COQ-10 PO) Take 200 mg by mouth daily.     . hydrochlorothiazide (MICROZIDE) 12.5 MG capsule Take 1 capsule (12.5 mg total) by mouth daily. 90 capsule 3  . ipratropium (ATROVENT) 0.03 % nasal spray     . isosorbide mononitrate (IMDUR) 60 MG 24 hr tablet Take 60 mg by mouth daily.      . losartan (COZAAR) 100 MG tablet Take 100 mg by mouth at bedtime.     . MAGNESIUM CITRATE PO Take by mouth daily.    . Misc Natural Products (TOTAL MEMORY & FOCUS FORMULA PO) Take 2 capsules by mouth daily with lunch.    . Multiple Vitamins-Minerals  (VISION PLUS) CAPS Take by mouth daily.    . nitroGLYCERIN (NITROSTAT) 0.4 MG SL tablet Place 0.4 mg under the tongue every 5 (five) minutes as needed.      . OVER THE COUNTER MEDICATION Clear ear complex vitamin . 1 capsule at noon.    . OVER THE COUNTER MEDICATION Sugar equilibrium  1 capsule twice daily ]    . polyethylene glycol (MIRALAX / GLYCOLAX) packet Take 17 g by mouth as needed.      . simvastatin (ZOCOR) 40 MG tablet Take 0.5 tablets (20 mg total) by mouth at bedtime. 30 tablet   . Specialty Vitamins Products (PROSTATE PO) Take by mouth 3 (three) times daily after meals.     No current facility-administered medications for this visit.    No Known Allergies  Past Medical History  Diagnosis Date  . Coronary artery disease     PCI of RCA 2011  . Acute MI, anterior wall     remote  . Diabetes mellitus   . Hypertension   . Dyslipidemia   . Prostatism   . Sinusitis   . Diabetes mellitus type 2, uncontrolled 08/10/2014    Past Surgical History  Procedure Laterality Date  . Cardiac catheterization  08/28/2010  . Cardiac catheterization  09/08/2001      THERE IS MID ANTERIOR WALL HYPOKINESIA WITH OVERALL WELL PRESERVED LEFT VENTRICULAR SYSTOLIC FUNCTION. EF 55%  . Coronary artery bypass graft  2002  . Pci  2011    TO THE RCA    History  Smoking status  . Former Smoker  Smokeless tobacco  . Not on file    History  Alcohol Use No    Family History  Problem Relation Age of Onset  . Arrhythmia Mother   . Diabetes Mother   . Heart attack Father     Review of Systems: The review of systems is per the HPI.  All other systems were reviewed and are negative.  Physical Exam: BP 124/60 mmHg  Pulse 62  Ht 5\' 4"  (1.626 m)  Wt 161 lb 9.6 oz (73.301 kg)  BMI 27.72 kg/m2 Patient is very pleasant and in no acute distress. Hard of hearing. Skin is warm and dry. Color is normal.  HEENT is unremarkable. Normocephalic/atraumatic. PERRL. Sclera are nonicteric. Neck is  supple. No masses. No JVD. Lungs are clear. Cardiac exam shows a regular rate and rhythm. Normal S1-2. No gallop. Soft murmur noted.  Abdomen is soft. Extremities are without edema. Gait and ROM are intact. No gross neurologic deficits noted.  LABORATORY DATA:   Lab Results  Component Value Date   WBC 7.9 06/08/2013   HGB 12.8* 06/08/2013   HCT 37.2* 06/08/2013   PLT 192.0 06/08/2013   GLUCOSE 118* 06/08/2013   CHOL 164 06/08/2013   TRIG 69.0 06/08/2013   HDL 37.90* 06/08/2013   LDLCALC 112* 06/08/2013   ALT 18 06/08/2013   AST 26 06/08/2013   NA 138 06/08/2013   K 4.0 06/08/2013   CL 104 06/08/2013   CREATININE 0.6 06/08/2013   BUN 20 06/08/2013   CO2 28 06/08/2013   Ecg today: NSR with RBBB. No acute change. I have personally reviewed and interpreted this study.  Assessment / Plan: 1. CAD - snow with progressive angina. Class 3.  - would continue with medications and use NTG as needed. I have recommended proceeding with cardiac cath with possible PCI. He is already on good medical therapy and has limiting angina. He is still in good health generally and may benefit from PCI. The procedure and risks were reviewed including but not limited to death, myocardial infarction, stroke, arrythmias, bleeding, transfusion, emergency surgery, dye allergy, or renal dysfunction. The patient voices understanding and is agreeable to proceed.   2. HTN - BP controlled.  3. HLD - On lower dose of Zocor with Norvasc. Will follow up fasting labs today.  4. Bradycardia - improved with stopping Toprol. Avoid any rate slowing meds.  5. DM type 2. Follow up with Dr. Reuel Boomaniel. Currently on no therapy. Check A1c. May need to reconsider oral therapy.  6. Vertigo. Will request copy of CT from August.

## 2014-08-11 LAB — PROTIME-INR
INR: 1 (ref <1.50)
Prothrombin Time: 13.2 seconds (ref 11.6–15.2)

## 2014-08-17 ENCOUNTER — Encounter: Payer: Self-pay | Admitting: Cardiology

## 2014-08-18 ENCOUNTER — Ambulatory Visit (HOSPITAL_COMMUNITY)
Admission: RE | Admit: 2014-08-18 | Discharge: 2014-08-19 | Disposition: A | Discharge status: Home / Self Care | Payer: Medicare Other | Source: Ambulatory Visit | Attending: Cardiology | Admitting: Cardiology

## 2014-08-18 ENCOUNTER — Encounter (HOSPITAL_COMMUNITY): Payer: Self-pay | Admitting: Pharmacy Technician

## 2014-08-18 ENCOUNTER — Encounter (HOSPITAL_COMMUNITY)
Admission: RE | Disposition: A | Discharge status: Home / Self Care | Payer: Medicare Other | Source: Ambulatory Visit | Attending: Cardiology

## 2014-08-18 ENCOUNTER — Encounter (HOSPITAL_COMMUNITY): Payer: Self-pay | Admitting: General Practice

## 2014-08-18 DIAGNOSIS — Z955 Presence of coronary angioplasty implant and graft: Secondary | ICD-10-CM | POA: Insufficient documentation

## 2014-08-18 DIAGNOSIS — I10 Essential (primary) hypertension: Secondary | ICD-10-CM | POA: Diagnosis present

## 2014-08-18 DIAGNOSIS — Z79899 Other long term (current) drug therapy: Secondary | ICD-10-CM | POA: Insufficient documentation

## 2014-08-18 DIAGNOSIS — R42 Dizziness and giddiness: Secondary | ICD-10-CM | POA: Insufficient documentation

## 2014-08-18 DIAGNOSIS — Z87891 Personal history of nicotine dependence: Secondary | ICD-10-CM | POA: Insufficient documentation

## 2014-08-18 DIAGNOSIS — I252 Old myocardial infarction: Secondary | ICD-10-CM | POA: Diagnosis not present

## 2014-08-18 DIAGNOSIS — I2584 Coronary atherosclerosis due to calcified coronary lesion: Secondary | ICD-10-CM | POA: Diagnosis not present

## 2014-08-18 DIAGNOSIS — I200 Unstable angina: Secondary | ICD-10-CM | POA: Diagnosis present

## 2014-08-18 DIAGNOSIS — Z7982 Long term (current) use of aspirin: Secondary | ICD-10-CM | POA: Insufficient documentation

## 2014-08-18 DIAGNOSIS — E785 Hyperlipidemia, unspecified: Secondary | ICD-10-CM | POA: Diagnosis present

## 2014-08-18 DIAGNOSIS — R001 Bradycardia, unspecified: Secondary | ICD-10-CM | POA: Diagnosis not present

## 2014-08-18 DIAGNOSIS — E119 Type 2 diabetes mellitus without complications: Secondary | ICD-10-CM | POA: Diagnosis not present

## 2014-08-18 DIAGNOSIS — Z951 Presence of aortocoronary bypass graft: Secondary | ICD-10-CM | POA: Diagnosis not present

## 2014-08-18 DIAGNOSIS — I2510 Atherosclerotic heart disease of native coronary artery without angina pectoris: Secondary | ICD-10-CM | POA: Diagnosis present

## 2014-08-18 DIAGNOSIS — I25110 Atherosclerotic heart disease of native coronary artery with unstable angina pectoris: Secondary | ICD-10-CM | POA: Diagnosis not present

## 2014-08-18 DIAGNOSIS — I209 Angina pectoris, unspecified: Secondary | ICD-10-CM | POA: Insufficient documentation

## 2014-08-18 DIAGNOSIS — Z9861 Coronary angioplasty status: Secondary | ICD-10-CM

## 2014-08-18 HISTORY — DX: Occlusion and stenosis of unspecified carotid artery: I65.29

## 2014-08-18 HISTORY — DX: Personal history of other diseases of the digestive system: Z87.19

## 2014-08-18 HISTORY — DX: Low back pain, unspecified: M54.50

## 2014-08-18 HISTORY — DX: Unspecified osteoarthritis, unspecified site: M19.90

## 2014-08-18 HISTORY — PX: LEFT HEART CATHETERIZATION WITH CORONARY ANGIOGRAM: SHX5451

## 2014-08-18 HISTORY — DX: Type 2 diabetes mellitus without complications: E11.9

## 2014-08-18 HISTORY — DX: Personal history of other medical treatment: Z92.89

## 2014-08-18 HISTORY — PX: CORONARY ANGIOPLASTY: SHX604

## 2014-08-18 HISTORY — DX: Cardiac murmur, unspecified: R01.1

## 2014-08-18 HISTORY — DX: Sleep apnea, unspecified: G47.30

## 2014-08-18 HISTORY — DX: Low back pain: M54.5

## 2014-08-18 HISTORY — DX: Other chronic pain: G89.29

## 2014-08-18 LAB — POCT ACTIVATED CLOTTING TIME: ACTIVATED CLOTTING TIME: 786 s

## 2014-08-18 LAB — GLUCOSE, CAPILLARY
GLUCOSE-CAPILLARY: 132 mg/dL — AB (ref 70–99)
Glucose-Capillary: 120 mg/dL — ABNORMAL HIGH (ref 70–99)
Glucose-Capillary: 257 mg/dL — ABNORMAL HIGH (ref 70–99)

## 2014-08-18 SURGERY — LEFT HEART CATHETERIZATION WITH CORONARY ANGIOGRAM
Anesthesia: LOCAL

## 2014-08-18 MED ORDER — VERAPAMIL HCL 2.5 MG/ML IV SOLN
INTRAVENOUS | Status: AC
Start: 2014-08-18 — End: 2014-08-18
  Filled 2014-08-18: qty 2

## 2014-08-18 MED ORDER — BIVALIRUDIN 250 MG IV SOLR
INTRAVENOUS | Status: AC
Start: 2014-08-18 — End: 2014-08-18
  Filled 2014-08-18: qty 250

## 2014-08-18 MED ORDER — HEPARIN SODIUM (PORCINE) 1000 UNIT/ML IJ SOLN
INTRAMUSCULAR | Status: AC
Start: 2014-08-18 — End: 2014-08-18
  Filled 2014-08-18: qty 1

## 2014-08-18 MED ORDER — LIDOCAINE HCL (PF) 1 % IJ SOLN
INTRAMUSCULAR | Status: AC
Start: 2014-08-18 — End: 2014-08-18
  Filled 2014-08-18: qty 30

## 2014-08-18 MED ORDER — HYDROCHLOROTHIAZIDE 12.5 MG PO CAPS
12.5000 mg | ORAL_CAPSULE | Freq: Every day | ORAL | Status: DC | PRN
  Filled 2014-08-18: qty 1

## 2014-08-18 MED ORDER — ADULT MULTIVITAMIN W/MINERALS CH
1.0000 | ORAL_TABLET | Freq: Every day | ORAL | Status: DC
  Administered 2014-08-18: 1 via ORAL
  Filled 2014-08-18 (×2): qty 1

## 2014-08-18 MED ORDER — ASPIRIN 81 MG PO CHEW
CHEWABLE_TABLET | ORAL | Status: AC
Start: 2014-08-18 — End: 2014-08-18
  Administered 2014-08-18: 81 mg via ORAL
  Filled 2014-08-18: qty 1

## 2014-08-18 MED ORDER — CLOPIDOGREL BISULFATE 300 MG PO TABS
ORAL_TABLET | ORAL | Status: AC
  Filled 2014-08-18: qty 1

## 2014-08-18 MED ORDER — ASPIRIN 81 MG PO CHEW
81.0000 mg | CHEWABLE_TABLET | Freq: Every day | ORAL | Status: DC
  Filled 2014-08-18: qty 1

## 2014-08-18 MED ORDER — ACETAMINOPHEN 325 MG PO TABS: 650.0000 mg | ORAL_TABLET | ORAL | Status: DC | PRN

## 2014-08-18 MED ORDER — CLOPIDOGREL BISULFATE 75 MG PO TABS
75.0000 mg | ORAL_TABLET | Freq: Every day | ORAL | Status: DC
  Administered 2014-08-19: 10:00:00 75 mg via ORAL
  Filled 2014-08-18: qty 1

## 2014-08-18 MED ORDER — NITROGLYCERIN 0.4 MG SL SUBL: 0.4000 mg | SUBLINGUAL_TABLET | SUBLINGUAL | Status: DC | PRN

## 2014-08-18 MED ORDER — HEPARIN (PORCINE) IN NACL 2-0.9 UNIT/ML-% IJ SOLN
INTRAMUSCULAR | Status: AC
  Filled 2014-08-18: qty 1000

## 2014-08-18 MED ORDER — AMLODIPINE BESYLATE 5 MG PO TABS
5.0000 mg | ORAL_TABLET | Freq: Every day | ORAL | Status: DC
  Filled 2014-08-18: qty 1

## 2014-08-18 MED ORDER — LOSARTAN POTASSIUM 50 MG PO TABS
100.0000 mg | ORAL_TABLET | Freq: Every day | ORAL | Status: DC
  Administered 2014-08-18: 21:00:00 100 mg via ORAL
  Filled 2014-08-18 (×2): qty 2

## 2014-08-18 MED ORDER — SODIUM CHLORIDE 0.9 % IV SOLN
INTRAVENOUS | Status: DC
  Administered 2014-08-18: 11:00:00 via INTRAVENOUS

## 2014-08-18 MED ORDER — SODIUM CHLORIDE 0.9 % IJ SOLN: 3.0000 mL | Freq: Two times a day (BID) | INTRAMUSCULAR | Status: DC

## 2014-08-18 MED ORDER — SODIUM CHLORIDE 0.9 % IV SOLN
1.0000 mL/kg/h | INTRAVENOUS | Status: AC
  Administered 2014-08-18: 1 mL/kg/h via INTRAVENOUS

## 2014-08-18 MED ORDER — SODIUM CHLORIDE 0.9 % IJ SOLN: 3.0000 mL | INTRAMUSCULAR | Status: DC | PRN

## 2014-08-18 MED ORDER — ASPIRIN 81 MG PO CHEW
81.0000 mg | CHEWABLE_TABLET | ORAL | Status: AC
  Administered 2014-08-18: 81 mg via ORAL

## 2014-08-18 MED ORDER — ISOSORBIDE MONONITRATE ER 60 MG PO TB24
60.0000 mg | ORAL_TABLET | Freq: Every day | ORAL | Status: DC
  Administered 2014-08-18: 17:00:00 60 mg via ORAL
  Filled 2014-08-18 (×2): qty 1

## 2014-08-18 MED ORDER — MIDAZOLAM HCL 2 MG/2ML IJ SOLN
INTRAMUSCULAR | Status: AC
  Filled 2014-08-18: qty 2

## 2014-08-18 MED ORDER — POLYETHYLENE GLYCOL 3350 17 G PO PACK
17.0000 g | PACK | ORAL | Status: DC | PRN
  Filled 2014-08-18: qty 1

## 2014-08-18 MED ORDER — SODIUM CHLORIDE 0.9 % IV SOLN: 250.0000 mL | INTRAVENOUS | Status: DC | PRN

## 2014-08-18 MED ORDER — SIMVASTATIN 20 MG PO TABS
20.0000 mg | ORAL_TABLET | Freq: Every day | ORAL | Status: DC
Start: 2014-08-18 — End: 2014-08-19
  Administered 2014-08-18: 21:00:00 20 mg via ORAL
  Filled 2014-08-18 (×2): qty 1

## 2014-08-18 MED ORDER — NITROGLYCERIN 1 MG/10 ML FOR IR/CATH LAB
INTRA_ARTERIAL | Status: AC
  Filled 2014-08-18: qty 10

## 2014-08-18 MED ORDER — ONDANSETRON HCL 4 MG/2ML IJ SOLN: 4.0000 mg | Freq: Four times a day (QID) | INTRAMUSCULAR | Status: DC | PRN

## 2014-08-18 NOTE — H&P (View-Only) (Signed)
Tommy Patterson Date of Birth: 1924-01-12 Medical Record #[id number]  History of Present Illness: Tommy Patterson is seen back today for follow up CAD.He has known CAD s/p CABG in 2002 and with prior PCI/stent of the RCA in December of 2011 with DES. Other issues include DM, HTN, and HLD. He is intolerant of beta blockers due to bradycardia. He is on statin therapy. On follow up today he reports increased anginal symptoms over the past 4-5 months. He develops chest pain with exertion and has to take sl Ntg to continue. This has worsened and is now class 3. He still tries to stay active doing his own housework and yardwork but has been very limited by chest pain. He does note some vertigo in August and had [facility] which apparently showed some carotid disease. He reports he was taken off metformin and his sugars are not doing as well. BS now 189.    Current Outpatient Prescriptions  Medication Sig Dispense Refill  . amLODipine (NORVASC) 5 MG tablet Take 1 tablet (5 mg total) by mouth daily. 180 tablet 3  . aspirin 81 MG tablet Take 81 mg by mouth daily.      . Bilberry, Vaccinium myrtillus, (BILBERRY PO) Take 1 tablet by mouth 2 (two) times daily.    . Carboxymethylcellul-Glycerin (REFRESH OPTIVE OP) Apply to eye 2 (two) times daily.    . Cholecalciferol (VITAMIN D) 2000 UNITS CAPS Take 1 capsule by mouth daily.    . Coenzyme Q10 (COQ-10 PO) Take 200 mg by mouth daily.     . hydrochlorothiazide (MICROZIDE) 12.5 MG capsule Take 1 capsule (12.5 mg total) by mouth daily. 90 capsule 3  . ipratropium (ATROVENT) 0.03 % nasal spray     . isosorbide mononitrate (IMDUR) 60 MG 24 hr tablet Take 60 mg by mouth daily.      Marland Kitchen. losartan (COZAAR) 100 MG tablet Take 100 mg by mouth at bedtime.     Marland Kitchen. MAGNESIUM CITRATE PO Take by mouth daily.    . Misc Natural Products (TOTAL MEMORY & FOCUS FORMULA PO) Take 2 capsules by mouth daily with lunch.    . Multiple Vitamins-Minerals  (VISION PLUS) CAPS Take by mouth daily.    . nitroGLYCERIN (NITROSTAT) 0.4 MG SL tablet Place 0.4 mg under the tongue every 5 (five) minutes as needed.      Marland Kitchen. OVER THE COUNTER MEDICATION Clear ear complex vitamin . 1 capsule at noon.    Marland Kitchen. OVER THE COUNTER MEDICATION Sugar equilibrium  1 capsule twice daily ]    . polyethylene glycol (MIRALAX / GLYCOLAX) packet Take 17 g by mouth as needed.      . simvastatin (ZOCOR) 40 MG tablet Take 0.5 tablets (20 mg total) by mouth at bedtime. 30 tablet   . Specialty Vitamins Products (PROSTATE PO) Take by mouth 3 (three) times daily after meals.     No current facility-administered medications for this visit.    No Known Allergies  Past Medical History  Diagnosis Date  . Coronary artery disease     PCI of RCA 2011  . Acute MI, anterior wall     remote  . Diabetes mellitus   . Hypertension   . Dyslipidemia   . Prostatism   . Sinusitis   . Diabetes mellitus type 2, uncontrolled 08/10/2014    Past Surgical History  Procedure Laterality Date  . Cardiac catheterization  08/28/2010  . Cardiac catheterization  09/08/2001  THERE IS MID ANTERIOR WALL HYPOKINESIA WITH OVERALL WELL PRESERVED LEFT VENTRICULAR SYSTOLIC FUNCTION. EF 55%  . Coronary artery bypass graft  2002  . Pci  2011    TO THE RCA    History  Smoking status  . Former Smoker  Smokeless tobacco  . Not on file    History  Alcohol Use No    Family History  Problem Relation Age of Onset  . Arrhythmia Mother   . Diabetes Mother   . Heart attack Father     Review of Systems: The review of systems is per the HPI.  All other systems were reviewed and are negative.  Physical Exam: BP 124/60 mmHg  Pulse 62  Ht 5\' 4"  (1.626 m)  Wt 161 lb 9.6 oz (73.301 kg)  BMI 27.72 kg/m2 Patient is very pleasant and in no acute distress. Hard of hearing. Skin is warm and dry. Color is normal.  HEENT is unremarkable. Normocephalic/atraumatic. PERRL. Sclera are nonicteric. Neck is  supple. No masses. No JVD. Lungs are clear. Cardiac exam shows a regular rate and rhythm. Normal S1-2. No gallop. Soft murmur noted.  Abdomen is soft. Extremities are without edema. Gait and ROM are intact. No gross neurologic deficits noted.  LABORATORY DATA:   Lab Results  Component Value Date   WBC 7.9 06/08/2013   HGB 12.8* 06/08/2013   HCT 37.2* 06/08/2013   PLT 192.0 06/08/2013   GLUCOSE 118* 06/08/2013   CHOL 164 06/08/2013   TRIG 69.0 06/08/2013   HDL 37.90* 06/08/2013   LDLCALC 112* 06/08/2013   ALT 18 06/08/2013   AST 26 06/08/2013   NA 138 06/08/2013   K 4.0 06/08/2013   CL 104 06/08/2013   CREATININE 0.6 06/08/2013   BUN 20 06/08/2013   CO2 28 06/08/2013   Ecg today: NSR with RBBB. No acute change. I have personally reviewed and interpreted this study.  Assessment / Plan: 1. CAD - snow with progressive angina. Class 3.  - would continue with medications and use NTG as needed. I have recommended proceeding with cardiac cath with possible PCI. He is already on good medical therapy and has limiting angina. He is still in good health generally and may benefit from PCI. The procedure and risks were reviewed including but not limited to death, myocardial infarction, stroke, arrythmias, bleeding, transfusion, emergency surgery, dye allergy, or renal dysfunction. The patient voices understanding and is agreeable to proceed.   2. HTN - BP controlled.  3. HLD - On lower dose of Zocor with Norvasc. Will follow up fasting labs today.  4. Bradycardia - improved with stopping Toprol. Avoid any rate slowing meds.  5. DM type 2. Follow up with Dr. Reuel Boomaniel. Currently on no therapy. Check A1c. May need to reconsider oral therapy.  6. Vertigo. Will request copy of CT from August.

## 2014-08-18 NOTE — Care Management Note (Signed)
    Page 1 of 1   08/18/2014     4:00:09 PM CARE MANAGEMENT NOTE 08/18/2014  Patient:  Tommy HaberMOORE,Tommy Patterson   Account Number:  [id number]  Date Initiated:  08/18/2014  Documentation initiated by:  Donato SchultzHUTCHINSON,Mayco Walrond  Subjective/Objective Assessment:   CAD     Action/Plan:   CM to follow for disposition needs   Anticipated DC Date:  08/19/2014   Anticipated DC Plan:  HOME/SELF CARE         Choice offered to / List presented to:             Status of service:  Completed, signed off Medicare Important Message given?   (If response is "NO", the following Medicare IM given date fields will be blank) Date Medicare IM given:   Medicare IM given by:   Date Additional Medicare IM given:   Additional Medicare IM given by:    Discharge Disposition:  HOME/SELF CARE  Per UR Regulation:    If discussed at Long Length of Stay Meetings, dates discussed:    Comments:  Lynzi Meulemans RN, BSN, MSHL, CCM  Nurse - Case Manager,  (Unit [phone])  [phone]  08/18/2014 Specialty Med Review:  Plavix Dispo Plan:  Home / Self care

## 2014-08-18 NOTE — Interval H&P Note (Signed)
History and Physical Interval Note:  08/18/2014 12:23 PM  Tommy Patterson  has presented today for surgery, with the diagnosis of known cad c/p  The various methods of treatment have been discussed with the patient and family. After consideration of risks, benefits and other options for treatment, the patient has consented to  Procedure(s): LEFT HEART CATHETERIZATION WITH CORONARY ANGIOGRAM (N/A) as a surgical intervention .  The patient's history has been reviewed, patient examined, no change in status, stable for surgery.  I have reviewed the patient's chart and labs.  Questions were answered to the patient's satisfaction.     Peter SwazilandJordan MD,FACC 08/18/2014 12:23 PM Cath Lab Visit (complete for each Cath Lab visit)  Clinical Evaluation Leading to the Procedure:   ACS: No.  Non-ACS:    Anginal Classification: CCS III  Anti-ischemic medical therapy: Maximal Therapy (2 or more classes of medications)  Non-Invasive Test Results: No non-invasive testing performed  Prior CABG: Previous CABG

## 2014-08-18 NOTE — CV Procedure (Signed)
Cardiac Catheterization Procedure Note  Name: Tommy HaberJohn Oliver Drumwright MRN: [id number] DOB: Aug 21, 1924  Procedure: Left Heart Cath, Selective Coronary Angiography, SVG and LIMA angiography, LV angiography, PTCA and  of the first OM.  Indication: 80 yo WM with remote CABG. S/p stenting of the native RCA in 2011. He presents with increasing anginal symptoms class 3 despite optimal medical therapy.  Procedural Details:  The left wrist was prepped, draped, and anesthetized with 1% lidocaine. Using the modified Seldinger technique, a 6 French slender sheath was introduced into the right radial artery. 3 mg of verapamil was administered through the sheath, weight-based unfractionated heparin was administered intravenously. There was a radial loop that we were able to cross. Standard Judkins catheters were used for selective coronary angiography, graft, and left ventriculography. A left Amplatz catheter was used for the SVGs. Catheter exchanges were performed over an exchange length guidewire.  PROCEDURAL FINDINGS Hemodynamics: AO 148/52 mean 88 mm Hg LV 150/21 mm Hg   Coronary angiography: Coronary dominance: right  Left mainstem: Normal.   Left anterior descending (LAD): The LAD is occluded after a small diagonal branch.   Left circumflex (LCx): The LCx is a large vessel. It gives rise to a large OM1, then 2 smaller OM branches before terminating in a PL branch. The mid LCx has an 80% stenosis. The PL branch fills competitively from the SVG. The first OM has 40% disease at the origin. There is a 95% stenosis in the mid OM at a very tight bend in the vessel with TIMI 2 flow.   Right coronary artery (RCA): The RCA is a dominant vessel. It is calcified. There is a focal 30% stenosis in the mid vessel. The stent in the mid vessel is widely patent. There is 20-30% disease in the distal vessel.   The LIMA graft to the LAD is widely patent  The SVG to the first diagonal is widely patent.  The SVG  to the PL of the LCx is widely patent.   Left ventriculography: Left ventricular systolic function is normal, LVEF is estimated at 55-65%, there is no significant mitral regurgitation   PCI Note:  Following the diagnostic procedure, the decision was made to proceed with PCI of the OM1.  Weight-based bivalirudin was given for anticoagulation. Plavix 600 mg was given orally. Once a therapeutic ACT was achieved, a 6 JamaicaFrench left Voda 4 guide catheter was inserted.  A Whisper coronary guidewire was used to cross the lesion.  The lesion was predilated with a 2.0 mm balloon.  We were unable to cross the lesion with a stent. The Whisper wire was extended with a Doc wire and a OTW balloon was passed into the distal OM. A Mailman wire was placed through the OTW balloon to try and get better support. Due to the extreme tortuosity of the vessel and torque when we attempted to remove the OTW balloon the wire position was lost.  Following PCI, there was 20% residual stenosis and TIMI-3 flow. Rather than trying to recross this complicated lesion I was quite satisfied with the balloon result and the procedure was stopped. Final angiography confirmed an excellent result. The patient tolerated the procedure well. There were no immediate procedural complications. Follow up angiography of the radial loop showed no injury.  A TR band was used for radial hemostasis. The patient was transferred to the post catheterization recovery area for further monitoring.  PCI Data: Vessel - OM1/Segment - mid  Percent Stenosis (pre)  95% TIMI-flow 2  PTCA only Percent Stenosis (post) <20% TIMI-flow (post) 3  Final Conclusions:   1. Severe 3 vessel obstructive CAD 2. Patent LIMA to the LAD 3. Patent SVG to the RCA 4. Patent SVG to the PL branch of the LCx 5. Patent stent in the mid RCA 6. Normal LV function. 7. Successful POBA of the first OM.   Recommendations:  Would continue ASA and Plavix for one month then DC Plavix.  Anticipate DC in am.   Deklyn Gibbon Swazil[place]Jordan, MDFACC 08/18/2014, 1:43 PM

## 2014-08-19 DIAGNOSIS — I200 Unstable angina: Secondary | ICD-10-CM

## 2014-08-19 DIAGNOSIS — I2584 Coronary atherosclerosis due to calcified coronary lesion: Secondary | ICD-10-CM | POA: Diagnosis not present

## 2014-08-19 DIAGNOSIS — I10 Essential (primary) hypertension: Secondary | ICD-10-CM

## 2014-08-19 DIAGNOSIS — Z951 Presence of aortocoronary bypass graft: Secondary | ICD-10-CM | POA: Diagnosis not present

## 2014-08-19 DIAGNOSIS — I25110 Atherosclerotic heart disease of native coronary artery with unstable angina pectoris: Secondary | ICD-10-CM | POA: Diagnosis not present

## 2014-08-19 DIAGNOSIS — Z955 Presence of coronary angioplasty implant and graft: Secondary | ICD-10-CM | POA: Diagnosis not present

## 2014-08-19 DIAGNOSIS — E785 Hyperlipidemia, unspecified: Secondary | ICD-10-CM

## 2014-08-19 LAB — BASIC METABOLIC PANEL
ANION GAP: 12 (ref 5–15)
BUN: 18 mg/dL (ref 6–23)
CALCIUM: 9.3 mg/dL (ref 8.4–10.5)
CO2: 22 mEq/L (ref 19–32)
Chloride: 105 mEq/L (ref 96–112)
Creatinine, Ser: 0.62 mg/dL (ref 0.50–1.35)
GFR, EST NON AFRICAN AMERICAN: 86 mL/min — AB (ref >90)
Glucose, Bld: 158 mg/dL — ABNORMAL HIGH (ref 70–99)
POTASSIUM: 4.2 meq/L (ref 3.7–5.3)
SODIUM: 139 meq/L (ref 137–147)

## 2014-08-19 LAB — CBC
HCT: 34.9 % — ABNORMAL LOW (ref 39.0–52.0)
Hemoglobin: 12.1 g/dL — ABNORMAL LOW (ref 13.0–17.0)
MCH: 30.4 pg (ref 26.0–34.0)
MCHC: 34.7 g/dL (ref 30.0–36.0)
MCV: 87.7 fL (ref 78.0–100.0)
PLATELETS: 201 10*3/uL (ref 150–400)
RBC: 3.98 MIL/uL — AB (ref 4.22–5.81)
RDW: 12.8 % (ref 11.5–15.5)
WBC: 7.8 10*3/uL (ref 4.0–10.5)

## 2014-08-19 LAB — GLUCOSE, CAPILLARY: GLUCOSE-CAPILLARY: 163 mg/dL — AB (ref 70–99)

## 2014-08-19 MED ORDER — CLOPIDOGREL BISULFATE 75 MG PO TABS: 75.0000 mg | ORAL_TABLET | Freq: Every day | ORAL | Status: DC

## 2014-08-19 MED FILL — Sodium Chloride IV Soln 0.9%: INTRAVENOUS | Qty: 50 | Status: AC

## 2014-08-19 NOTE — Discharge Instructions (Signed)
PLEASE REMEMBER TO BRING ALL OF YOUR MEDICATIONS TO EACH OF YOUR FOLLOW-UP OFFICE VISITS. ° °PLEASE ATTEND ALL SCHEDULED FOLLOW-UP APPOINTMENTS.  ° °Activity: Increase activity slowly as tolerated. You may shower, but no soaking baths (or swimming) for 1 week. No driving for 2 days. No lifting over 5 lbs for 1 week. No sexual activity for 1 week.  ° °You May Return to Work: in 1 week (if applicable) ° °Wound Care: You may wash cath site gently with soap and water. Keep cath site clean and dry. If you notice pain, swelling, bleeding or pus at your cath site, please call [phone]. ° ° ° °Cardiac Cath Site Care °Refer to this sheet in the next few weeks. These instructions provide you with information on caring for yourself after your procedure. Your caregiver may also give you more specific instructions. Your treatment has been planned according to current medical practices, but problems sometimes occur. Call your caregiver if you have any problems or questions after your procedure. °HOME CARE INSTRUCTIONS °· You may shower 24 hours after the procedure. Remove the bandage (dressing) and gently wash the site with plain soap and water. Gently pat the site dry.  °· Do not apply powder or lotion to the site.  °· Do not sit in a bathtub, swimming pool, or whirlpool for 5 to 7 days.  °· No bending, squatting, or lifting anything over 10 pounds (4.5 kg) as directed by your caregiver.  °· Inspect the site at least twice daily.  °· Do not drive home if you are discharged the same day of the procedure. Have someone else drive you.  °· You may drive 24 hours after the procedure unless otherwise instructed by your caregiver.  °What to expect: °· Any bruising will usually fade within 1 to 2 weeks.  °· Blood that collects in the tissue (hematoma) may be painful to the touch. It should usually decrease in size and tenderness within 1 to 2 weeks.  °SEEK IMMEDIATE MEDICAL CARE IF: °· You have unusual pain at the site or down the  affected limb.  °· You have redness, warmth, swelling, or pain at the site.  °· You have drainage (other than a small amount of blood on the dressing).  °· You have chills.  °· You have a fever or persistent symptoms for more than 72 hours.  °· You have a fever and your symptoms suddenly get worse.  °· Your leg becomes pale, cool, tingly, or numb.  °· You have heavy bleeding from the site. Hold pressure on the site.  °Document Released: 10/06/2010 Document Revised: 08/23/2011 Document Reviewed: 10/06/2010 °ExitCare® Patient Information ©2012 [employer]. ° °

## 2014-08-19 NOTE — Plan of Care (Signed)
Problem: Consults Goal: Cardiac Cath Patient Education (See Patient Education module for education specifics.) Outcome: Completed/Met Date Met:  08/19/14 Goal: Skin Care Protocol Initiated - if Braden Score 18 or less If consults are not indicated, leave blank or document N/A Outcome: Completed/Met Date Met:  08/19/14 Goal: Diabetes Guidelines if Diabetic/Glucose > 140 If diabetic or lab glucose is > 140 mg/dl - Initiate Diabetes/Hyperglycemia Guidelines & Document Interventions  Outcome: Completed/Met Date Met:  08/19/14  Problem: Phase I Progression Outcomes Goal: Pain controlled with appropriate interventions Outcome: Completed/Met Date Met:  08/19/14 Goal: Voiding-avoid urinary catheter unless indicated Outcome: Completed/Met Date Met:  08/19/14 Goal: Hemodynamically stable Outcome: Completed/Met Date Met:  08/19/14 Goal: Distal pulses equal to baseline Outcome: Completed/Met Date Met:  08/19/14 Goal: Vascular site scale level 0 - I Vascular Site Scale Level 0: No bruising/bleeding/hematoma Level I (Mild): Bruising/Ecchymosis, minimal bleeding/ooozing, palpable hematoma < 3 cm Level II (Moderate): Bleeding not affecting hemodynamic parameters, pseudoaneurysm, palpable hematoma > 3 cm Level III (Severe) Bleeding which affects hemodynamic parameters or retroperitoneal hemorrhage  Outcome: Completed/Met Date Met:  08/19/14

## 2014-08-19 NOTE — Progress Notes (Signed)
CARDIAC REHAB PHASE I   PRE:  Rate/Rhythm: 88 SR  BP:  Supine:   Sitting: 154/84  Standing:    SaO2:   MODE:  Ambulation: 1000 ft   POST:  Rate/Rhythm: 101 ST  BP:  Supine:   Sitting: 168/65  Standing:    SaO2:  0805-0920 Pt tolerated ambulation well using his cane. Gait steady, no c/o of cp or SOB. Completed PTCA education with pt and son. He voices understanding. [place]. [place], will send referral.  Melina CopaLisa Madyn Ivins RN 08/19/2014 9:27 AM

## 2014-08-19 NOTE — Progress Notes (Signed)
     Patient Name: Tommy HaberJohn Oliver Patterson Date of Encounter: 08/19/2014  Principal Problem:   Angina pectoris, crescendo Active Problems:   Coronary artery disease   Hypertension   Dyslipidemia   Primary Cardiologist: Dr. SwazilandJordan  Patient Profile: 80 yo male w/ hx CABG 2002, PCI RCA 2011, DM, HTN, HLD, bradycardia (no BB), seen by Dr SwazilandJordan for crescendo angina, came in for cath 12/02, PCI OM1.  SUBJECTIVE: No chest pain or SOB, back pain is chronic. Lives alone but daughter will stay with him for a few days.  OBJECTIVE Filed Vitals:   08/18/14 2124 08/18/14 2357 08/19/14 0054 08/19/14 0558  BP: 113/43 96/45  131/43  Pulse: 61 55  57  Temp: 97.9 F (36.6 C) 97.5 F (36.4 C)  97.7 F (36.5 C)  TempSrc: Oral Oral  Oral  Resp: 18 18  20   Height:      Weight:   156 lb 8.4 oz (71 kg)   SpO2: 96% 96%  97%    Intake/Output Summary (Last 24 hours) at 08/19/14 [id number] Last data filed at 08/19/14 0604  Gross per 24 hour  Intake  780.9 ml  Output   2000 ml  Net -1219.1 ml   Filed Weights   08/18/14 1010 08/19/14 0054  Weight: 156 lb (70.761 kg) 156 lb 8.4 oz (71 kg)    PHYSICAL EXAM General: Well developed, well nourished, male in no acute distress. Head: Normocephalic, atraumatic.  Neck: Supple without bruits, JVD not elevated Lungs:  Resp regular and unlabored, CTA. Heart: RRR, S1, S2, no S3, S4, 2/6 murmur; no rub. Abdomen: Soft, non-tender, non-distended, BS + x 4.  Extremities: No clubbing, cyanosis, no edema. Left radial without hematoma/ecchymosis. Neuro: Alert and oriented X 3. Moves all extremities spontaneously. Psych: Normal affect.  LABS: CBC:  Recent Labs  08/19/14 0446  WBC 7.8  HGB 12.1*  HCT 34.9*  MCV 87.7  PLT 201   Basic Metabolic Panel:  Recent Labs  19/14/7810/11/30 0446  NA 139  K 4.2  CL 105  CO2 22  GLUCOSE 158*  BUN 18  CREATININE 0.62  CALCIUM 9.3   TELE:  SR  Current Medications:  . amLODipine  5 mg Oral Daily  . aspirin  81 mg  Oral Daily  . clopidogrel  75 mg Oral Q breakfast  . isosorbide mononitrate  60 mg Oral Q1200  . losartan  100 mg Oral QHS  . multivitamin with minerals  1 tablet Oral Daily  . simvastatin  20 mg Oral QHS   ASSESSMENT AND PLAN: Principal Problem:   Angina pectoris, crescendo - continue ASA, Plavix, ARB, statin, no BB with bradycardia. Continue Imdur.   Active Problems:   Coronary artery disease - see above    Hypertension - on home rx    Dyslipidemia - on statin, f/u in office.  Plan -ambulate and discharge today if does OK, daughter will care for him.  SignedTheodore Demark, Rhonda Barrett , PA-C 7:12 AM 08/19/2014

## 2014-08-26 ENCOUNTER — Encounter (HOSPITAL_COMMUNITY): Payer: Self-pay | Admitting: Cardiology

## 2014-08-27 ENCOUNTER — Encounter: Payer: Self-pay | Admitting: Cardiology

## 2014-08-27 ENCOUNTER — Ambulatory Visit (INDEPENDENT_AMBULATORY_CARE_PROVIDER_SITE_OTHER): Payer: Medicare Other | Admitting: Cardiology

## 2014-08-27 VITALS — BP 130/74 | HR 60 | Ht 63.0 in | Wt 159.7 lb

## 2014-08-27 DIAGNOSIS — I25110 Atherosclerotic heart disease of native coronary artery with unstable angina pectoris: Secondary | ICD-10-CM

## 2014-08-27 DIAGNOSIS — E785 Hyperlipidemia, unspecified: Secondary | ICD-10-CM

## 2014-08-27 DIAGNOSIS — I2510 Atherosclerotic heart disease of native coronary artery without angina pectoris: Secondary | ICD-10-CM

## 2014-08-27 DIAGNOSIS — R001 Bradycardia, unspecified: Secondary | ICD-10-CM

## 2014-08-27 DIAGNOSIS — I10 Essential (primary) hypertension: Secondary | ICD-10-CM

## 2014-08-27 NOTE — Patient Instructions (Signed)
Please follow up with Dr. Peter SwazilandJordan in 6 weeks.  It is ok for you to attend cardiac rehab and the chiropractor.
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
## 2014-08-28 NOTE — Assessment & Plan Note (Signed)
Status post remote anterior myocardial infarction. Status post CABG in 2002 by Dr. Donata ClayVan Trigt. This included an LIMA graft to the LAD, sequential saphenous vein graft to the circumflex and diagonal branches. He is status post stenting of the mid right coronary artery in December of 2011 with a 2.75 x 24 mm Promus element stent. Now 07/2014 POBA to OM1 One mild episode of chest pain, cleared for cardiac rehab..Marland Kitchen

## 2014-08-28 NOTE — Assessment & Plan Note (Signed)
  No BB 

## 2014-08-28 NOTE — Assessment & Plan Note (Signed)
Lipid Panel     Component Value Date/Time   CHOL 171 08/10/2014 1014   TRIG 74 08/10/2014 1014   HDL 42 08/10/2014 1014   CHOLHDL 4.1 08/10/2014 1014   VLDL 15 08/10/2014 1014   LDLCALC 114* 08/10/2014 1014   On zocor.

## 2014-08-28 NOTE — Assessment & Plan Note (Signed)
controlled 

## 2014-08-31 ENCOUNTER — Encounter (HOSPITAL_COMMUNITY): Payer: Self-pay

## 2014-08-31 ENCOUNTER — Encounter (HOSPITAL_COMMUNITY)
Admission: RE | Admit: 2014-08-31 | Discharge: 2014-08-31 | Disposition: A | Discharge status: Home / Self Care | Payer: Medicare Other | Source: Ambulatory Visit | Attending: Cardiology | Admitting: Cardiology

## 2014-08-31 VITALS — BP 124/52 | HR 67 | Ht 64.0 in | Wt 159.5 lb

## 2014-08-31 DIAGNOSIS — Z9861 Coronary angioplasty status: Secondary | ICD-10-CM

## 2014-08-31 DIAGNOSIS — I2510 Atherosclerotic heart disease of native coronary artery without angina pectoris: Secondary | ICD-10-CM | POA: Insufficient documentation

## 2014-08-31 DIAGNOSIS — Z955 Presence of coronary angioplasty implant and graft: Secondary | ICD-10-CM | POA: Insufficient documentation

## 2014-08-31 NOTE — Patient Instructions (Signed)
Pt has finished orientation and is scheduled to start CR on Monday, December 21 at 0930. Pt has been instructed to arrive to class 15 minutes early for scheduled class. Pt has been instructed to wear comfortable clothing and shoes with rubber soles. Pt has been told to take their medications 1 hour prior to coming to class.  If the patient is not going to attend class, he has been instructed to call.

## 2014-08-31 NOTE — Progress Notes (Signed)
Patient referred to Cardiac Rehab by Dr. Peter SwazilandJordan due to post PTCA (Z98.61). Dr. SwazilandJordan is his cardiologist and Dr. Aurther Lofterry is his PCP.  During orientation advised patient on arrival and appointment times what to wear, what to do before, during and after exercise.  Reviewed attendance and class policy.  Talked about inclement weather and class consultation policy. Patient is scheduled to start cardiac Rehab on December 21st, Monday at 0930.  Patient was advised to come to class 5 minutes before class starts.  He was also given instructions on meeting with the dietician and attending the Family Structure classes. Pt is eager to get started.

## 2014-09-06 ENCOUNTER — Encounter (HOSPITAL_COMMUNITY): Payer: Medicare Other

## 2014-09-08 ENCOUNTER — Encounter (HOSPITAL_COMMUNITY)
Admission: RE | Admit: 2014-09-08 | Discharge: 2014-09-08 | Disposition: A | Discharge status: Home / Self Care | Payer: Medicare Other | Source: Ambulatory Visit | Attending: Cardiology | Admitting: Cardiology

## 2014-09-08 DIAGNOSIS — Z955 Presence of coronary angioplasty implant and graft: Secondary | ICD-10-CM | POA: Diagnosis not present

## 2014-09-08 DIAGNOSIS — I2510 Atherosclerotic heart disease of native coronary artery without angina pectoris: Secondary | ICD-10-CM | POA: Diagnosis not present

## 2014-09-08 NOTE — Addendum Note (Signed)
Addended byMarella Bile: Kisa Fujii W. on: 09/08/2014 10:30 AM   Modules accepted: Orders

## 2014-09-10 ENCOUNTER — Encounter (HOSPITAL_COMMUNITY): Payer: Medicare Other

## 2014-09-13 ENCOUNTER — Encounter (HOSPITAL_COMMUNITY)
Admission: RE | Admit: 2014-09-13 | Discharge: 2014-09-13 | Disposition: A | Discharge status: Home / Self Care | Payer: Medicare Other | Source: Ambulatory Visit | Attending: Cardiology | Admitting: Cardiology

## 2014-09-13 DIAGNOSIS — I2510 Atherosclerotic heart disease of native coronary artery without angina pectoris: Secondary | ICD-10-CM | POA: Diagnosis not present

## 2014-09-15 ENCOUNTER — Encounter (HOSPITAL_COMMUNITY)
Admission: RE | Admit: 2014-09-15 | Discharge: 2014-09-15 | Disposition: A | Discharge status: Home / Self Care | Payer: Medicare Other | Source: Ambulatory Visit | Attending: Cardiology | Admitting: Cardiology

## 2014-09-15 DIAGNOSIS — I2510 Atherosclerotic heart disease of native coronary artery without angina pectoris: Secondary | ICD-10-CM | POA: Diagnosis not present

## 2014-09-15 NOTE — Progress Notes (Signed)
Cardiac Rehabilitation Program Outcomes Report   Orientation:  08/31/14 Graduate Date:  tbd Discharge Date:  tbd # of sessions completed: 3  Cardiologist: SwazilandJordan Family MD:  Kerrie Pleasureerry Class Time:  0930  A.  Exercise Program:  Tolerates exercise @ 3.47 METS for 15 minutes and Walk Test Results:  Pre: 2.02  B.  Mental Health:  Good mental attitude  C.  Education/Instruction/Skills  Accurately checks own pulse.  Rest:  73  Exercise:  91. Still working with patient on counting pulse correctly.   Uses Perceived Exertion Scale and/or Dyspnea Scale  D.  Nutrition/Weight Control/Body Composition:  Adherence to prescribed nutrition program: good    E.  Blood Lipids    Lab Results  Component Value Date   CHOL 171 08/10/2014   HDL 42 08/10/2014   LDLCALC 114* 08/10/2014   TRIG 74 08/10/2014   CHOLHDL 4.1 08/10/2014    F.  Lifestyle Changes:  Making positive lifestyle changes  G.  Symptoms noted with exercise:  Asymptomatic  Report Completed By:  Doretha Sou Derinda Bartus RN   Comments:  This is patients first week note.

## 2014-09-17 ENCOUNTER — Encounter (HOSPITAL_COMMUNITY): Payer: Medicare Other

## 2014-09-20 ENCOUNTER — Encounter (HOSPITAL_COMMUNITY)
Admission: RE | Admit: 2014-09-20 | Discharge: 2014-09-20 | Disposition: A | Discharge status: Home / Self Care | Payer: Medicare Other | Source: Ambulatory Visit | Attending: Cardiology | Admitting: Cardiology

## 2014-09-20 DIAGNOSIS — I2510 Atherosclerotic heart disease of native coronary artery without angina pectoris: Secondary | ICD-10-CM | POA: Insufficient documentation

## 2014-09-20 DIAGNOSIS — Z955 Presence of coronary angioplasty implant and graft: Secondary | ICD-10-CM | POA: Insufficient documentation

## 2014-09-22 ENCOUNTER — Encounter (HOSPITAL_COMMUNITY)
Admission: RE | Admit: 2014-09-22 | Discharge: 2014-09-22 | Disposition: A | Discharge status: Home / Self Care | Payer: Medicare Other | Source: Ambulatory Visit | Attending: Cardiology | Admitting: Cardiology

## 2014-09-22 DIAGNOSIS — I2510 Atherosclerotic heart disease of native coronary artery without angina pectoris: Secondary | ICD-10-CM | POA: Diagnosis not present

## 2014-09-24 ENCOUNTER — Encounter (HOSPITAL_COMMUNITY)
Admission: RE | Admit: 2014-09-24 | Discharge: 2014-09-24 | Disposition: A | Discharge status: Home / Self Care | Payer: Medicare Other | Source: Ambulatory Visit | Attending: Cardiology | Admitting: Cardiology

## 2014-09-24 DIAGNOSIS — I2510 Atherosclerotic heart disease of native coronary artery without angina pectoris: Secondary | ICD-10-CM | POA: Diagnosis not present

## 2014-09-27 ENCOUNTER — Encounter (HOSPITAL_COMMUNITY)
Admission: RE | Admit: 2014-09-27 | Discharge: 2014-09-27 | Disposition: A | Discharge status: Home / Self Care | Payer: Medicare Other | Source: Ambulatory Visit | Attending: Cardiology | Admitting: Cardiology

## 2014-09-27 DIAGNOSIS — I2510 Atherosclerotic heart disease of native coronary artery without angina pectoris: Secondary | ICD-10-CM | POA: Diagnosis not present

## 2014-09-29 ENCOUNTER — Encounter (HOSPITAL_COMMUNITY)
Admission: RE | Admit: 2014-09-29 | Discharge: 2014-09-29 | Disposition: A | Discharge status: Home / Self Care | Payer: Medicare Other | Source: Ambulatory Visit | Attending: Cardiology | Admitting: Cardiology

## 2014-09-29 DIAGNOSIS — I2510 Atherosclerotic heart disease of native coronary artery without angina pectoris: Secondary | ICD-10-CM | POA: Diagnosis not present

## 2014-10-01 ENCOUNTER — Encounter (HOSPITAL_COMMUNITY)
Admission: RE | Admit: 2014-10-01 | Discharge: 2014-10-01 | Disposition: A | Discharge status: Home / Self Care | Payer: Medicare Other | Source: Ambulatory Visit | Attending: Cardiology | Admitting: Cardiology

## 2014-10-01 DIAGNOSIS — I2510 Atherosclerotic heart disease of native coronary artery without angina pectoris: Secondary | ICD-10-CM | POA: Diagnosis not present

## 2014-10-04 ENCOUNTER — Encounter (HOSPITAL_COMMUNITY)
Admission: RE | Admit: 2014-10-04 | Discharge: 2014-10-04 | Disposition: A | Discharge status: Home / Self Care | Payer: Medicare Other | Source: Ambulatory Visit | Attending: Cardiology | Admitting: Cardiology

## 2014-10-04 DIAGNOSIS — I2510 Atherosclerotic heart disease of native coronary artery without angina pectoris: Secondary | ICD-10-CM | POA: Diagnosis not present

## 2014-10-06 ENCOUNTER — Encounter (HOSPITAL_COMMUNITY)
Admission: RE | Admit: 2014-10-06 | Discharge: 2014-10-06 | Disposition: A | Discharge status: Home / Self Care | Payer: Medicare Other | Source: Ambulatory Visit | Attending: Cardiology | Admitting: Cardiology

## 2014-10-06 DIAGNOSIS — I2510 Atherosclerotic heart disease of native coronary artery without angina pectoris: Secondary | ICD-10-CM | POA: Diagnosis not present

## 2014-10-08 ENCOUNTER — Encounter (HOSPITAL_COMMUNITY): Payer: Medicare Other

## 2014-10-11 ENCOUNTER — Encounter (HOSPITAL_COMMUNITY): Payer: Medicare Other

## 2014-10-13 ENCOUNTER — Encounter (HOSPITAL_COMMUNITY): Payer: Medicare Other

## 2014-10-13 ENCOUNTER — Ambulatory Visit (INDEPENDENT_AMBULATORY_CARE_PROVIDER_SITE_OTHER): Payer: Medicare Other | Admitting: Cardiology

## 2014-10-13 ENCOUNTER — Encounter: Payer: Self-pay | Admitting: Cardiology

## 2014-10-13 VITALS — BP 150/58 | HR 68 | Ht 66.0 in | Wt 155.7 lb

## 2014-10-13 DIAGNOSIS — R001 Bradycardia, unspecified: Secondary | ICD-10-CM

## 2014-10-13 DIAGNOSIS — I25110 Atherosclerotic heart disease of native coronary artery with unstable angina pectoris: Secondary | ICD-10-CM

## 2014-10-13 DIAGNOSIS — E1165 Type 2 diabetes mellitus with hyperglycemia: Secondary | ICD-10-CM

## 2014-10-13 DIAGNOSIS — E785 Hyperlipidemia, unspecified: Secondary | ICD-10-CM

## 2014-10-13 DIAGNOSIS — I10 Essential (primary) hypertension: Secondary | ICD-10-CM

## 2014-10-13 DIAGNOSIS — IMO0002 Reserved for concepts with insufficient information to code with codable children: Secondary | ICD-10-CM
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
## 2014-10-13 NOTE — Patient Instructions (Signed)
You may stop taking Plavix. Continue your other therapy.  I will see you back in 6 months.

## 2014-10-15 ENCOUNTER — Encounter (HOSPITAL_COMMUNITY)
Admission: RE | Admit: 2014-10-15 | Discharge: 2014-10-15 | Disposition: A | Discharge status: Home / Self Care | Payer: Medicare Other | Source: Ambulatory Visit | Attending: Cardiology | Admitting: Cardiology

## 2014-10-15 DIAGNOSIS — I2510 Atherosclerotic heart disease of native coronary artery without angina pectoris: Secondary | ICD-10-CM | POA: Diagnosis not present

## 2014-10-18 ENCOUNTER — Encounter (HOSPITAL_COMMUNITY)
Admission: RE | Admit: 2014-10-18 | Discharge: 2014-10-18 | Disposition: A | Discharge status: Home / Self Care | Payer: Medicare Other | Source: Ambulatory Visit | Attending: Cardiology | Admitting: Cardiology

## 2014-10-18 DIAGNOSIS — I2510 Atherosclerotic heart disease of native coronary artery without angina pectoris: Secondary | ICD-10-CM | POA: Diagnosis present

## 2014-10-18 DIAGNOSIS — Z955 Presence of coronary angioplasty implant and graft: Secondary | ICD-10-CM | POA: Insufficient documentation

## 2014-10-20 ENCOUNTER — Encounter (HOSPITAL_COMMUNITY)
Admission: RE | Admit: 2014-10-20 | Discharge: 2014-10-20 | Disposition: A | Discharge status: Home / Self Care | Payer: Medicare Other | Source: Ambulatory Visit | Attending: Cardiology | Admitting: Cardiology

## 2014-10-20 DIAGNOSIS — I2510 Atherosclerotic heart disease of native coronary artery without angina pectoris: Secondary | ICD-10-CM | POA: Diagnosis not present

## 2014-10-22 ENCOUNTER — Encounter (HOSPITAL_COMMUNITY)
Admission: RE | Admit: 2014-10-22 | Discharge: 2014-10-22 | Disposition: A | Discharge status: Home / Self Care | Payer: Medicare Other | Source: Ambulatory Visit | Attending: Cardiology | Admitting: Cardiology

## 2014-10-22 DIAGNOSIS — I2510 Atherosclerotic heart disease of native coronary artery without angina pectoris: Secondary | ICD-10-CM | POA: Diagnosis not present

## 2014-10-25 ENCOUNTER — Encounter (HOSPITAL_COMMUNITY)
Admission: RE | Admit: 2014-10-25 | Discharge: 2014-10-25 | Disposition: A | Discharge status: Home / Self Care | Payer: Medicare Other | Source: Ambulatory Visit | Attending: Cardiology | Admitting: Cardiology

## 2014-10-25 DIAGNOSIS — I2510 Atherosclerotic heart disease of native coronary artery without angina pectoris: Secondary | ICD-10-CM | POA: Diagnosis not present

## 2014-10-27 ENCOUNTER — Encounter (HOSPITAL_COMMUNITY)
Admission: RE | Admit: 2014-10-27 | Discharge: 2014-10-27 | Disposition: A | Discharge status: Home / Self Care | Payer: Medicare Other | Source: Ambulatory Visit | Attending: Cardiology | Admitting: Cardiology

## 2014-10-27 DIAGNOSIS — I2510 Atherosclerotic heart disease of native coronary artery without angina pectoris: Secondary | ICD-10-CM | POA: Diagnosis not present

## 2014-10-29 ENCOUNTER — Encounter (HOSPITAL_COMMUNITY)
Admission: RE | Admit: 2014-10-29 | Discharge: 2014-10-29 | Disposition: A | Discharge status: Home / Self Care | Payer: Medicare Other | Source: Ambulatory Visit | Attending: Cardiology | Admitting: Cardiology

## 2014-10-29 DIAGNOSIS — I2510 Atherosclerotic heart disease of native coronary artery without angina pectoris: Secondary | ICD-10-CM | POA: Diagnosis not present

## 2014-10-29 NOTE — Progress Notes (Signed)
Cardiac Rehabilitation Program Outcomes Report   Orientation:  08/31/14 Graduate Date:  tbd Discharge Date:  tbd # of sessions completed: 18  Cardiologist: P SwazilandJordan Family MD:  Nickie Retort Daniel Class Time:  0930  A.  Exercise Program:  Tolerates exercise @ 2.20 mets METS for 15 minutes and Walk Test Results:  Pre: 2.02 mets with walker  B.  Mental Health:  Good mental attitude  C.  Education/Instruction/Skills  Knows THR for exercise  Uses Perceived Exertion Scale and/or Dyspnea Scale  D.  Nutrition/Weight Control/Body Composition:  Adherence to prescribed nutrition program: good    E.  Blood Lipids    Lab Results  Component Value Date   CHOL 171 08/10/2014   HDL 42 08/10/2014   LDLCALC 114* 08/10/2014   TRIG 74 08/10/2014   CHOLHDL 4.1 08/10/2014    F.  Lifestyle Changes:  Making positive lifestyle changes  G.  Symptoms noted with exercise:  Asymptomatic  Report Completed By:  Doretha Sou Randi Poullard RN    Comments:  Patients halfway progress note. Patient is doing well in the program.

## 2014-11-01 ENCOUNTER — Encounter (HOSPITAL_COMMUNITY): Payer: Medicare Other

## 2014-11-03 ENCOUNTER — Encounter (HOSPITAL_COMMUNITY)
Admission: RE | Admit: 2014-11-03 | Discharge: 2014-11-03 | Disposition: A | Discharge status: Home / Self Care | Payer: Medicare Other | Source: Ambulatory Visit | Attending: Cardiology | Admitting: Cardiology

## 2014-11-03 DIAGNOSIS — I2510 Atherosclerotic heart disease of native coronary artery without angina pectoris: Secondary | ICD-10-CM | POA: Diagnosis not present

## 2014-11-05 ENCOUNTER — Encounter (HOSPITAL_COMMUNITY)
Admission: RE | Admit: 2014-11-05 | Discharge: 2014-11-05 | Disposition: A | Discharge status: Home / Self Care | Payer: Medicare Other | Source: Ambulatory Visit | Attending: Cardiology | Admitting: Cardiology

## 2014-11-05 DIAGNOSIS — I2510 Atherosclerotic heart disease of native coronary artery without angina pectoris: Secondary | ICD-10-CM | POA: Diagnosis not present

## 2014-11-08 ENCOUNTER — Encounter (HOSPITAL_COMMUNITY)
Admission: RE | Admit: 2014-11-08 | Discharge: 2014-11-08 | Disposition: A | Discharge status: Home / Self Care | Payer: Medicare Other | Source: Ambulatory Visit | Attending: Cardiology | Admitting: Cardiology

## 2014-11-08 DIAGNOSIS — I2510 Atherosclerotic heart disease of native coronary artery without angina pectoris: Secondary | ICD-10-CM | POA: Diagnosis not present

## 2014-11-10 ENCOUNTER — Encounter (HOSPITAL_COMMUNITY)
Admission: RE | Admit: 2014-11-10 | Discharge: 2014-11-10 | Disposition: A | Discharge status: Home / Self Care | Payer: Medicare Other | Source: Ambulatory Visit | Attending: Cardiology | Admitting: Cardiology

## 2014-11-10 DIAGNOSIS — I2510 Atherosclerotic heart disease of native coronary artery without angina pectoris: Secondary | ICD-10-CM | POA: Diagnosis not present

## 2014-11-12 ENCOUNTER — Encounter (HOSPITAL_COMMUNITY)
Admission: RE | Admit: 2014-11-12 | Discharge: 2014-11-12 | Disposition: A | Discharge status: Home / Self Care | Payer: Medicare Other | Source: Ambulatory Visit | Attending: Cardiology | Admitting: Cardiology

## 2014-11-12 DIAGNOSIS — I2510 Atherosclerotic heart disease of native coronary artery without angina pectoris: Secondary | ICD-10-CM | POA: Diagnosis not present

## 2014-11-15 ENCOUNTER — Encounter (HOSPITAL_COMMUNITY)
Admission: RE | Admit: 2014-11-15 | Discharge: 2014-11-15 | Disposition: A | Discharge status: Home / Self Care | Payer: Medicare Other | Source: Ambulatory Visit | Attending: Cardiology | Admitting: Cardiology

## 2014-11-15 DIAGNOSIS — I2510 Atherosclerotic heart disease of native coronary artery without angina pectoris: Secondary | ICD-10-CM | POA: Diagnosis not present

## 2014-11-17 ENCOUNTER — Encounter (HOSPITAL_COMMUNITY)
Admission: RE | Admit: 2014-11-17 | Discharge: 2014-11-17 | Disposition: A | Discharge status: Home / Self Care | Payer: Medicare Other | Source: Ambulatory Visit | Attending: Cardiology | Admitting: Cardiology

## 2014-11-17 DIAGNOSIS — I2510 Atherosclerotic heart disease of native coronary artery without angina pectoris: Secondary | ICD-10-CM | POA: Insufficient documentation

## 2014-11-17 DIAGNOSIS — Z955 Presence of coronary angioplasty implant and graft: Secondary | ICD-10-CM | POA: Diagnosis not present

## 2014-11-19 ENCOUNTER — Encounter (HOSPITAL_COMMUNITY)
Admission: RE | Admit: 2014-11-19 | Discharge: 2014-11-19 | Disposition: A | Discharge status: Home / Self Care | Payer: Medicare Other | Source: Ambulatory Visit | Attending: Cardiology | Admitting: Cardiology

## 2014-11-19 DIAGNOSIS — I2510 Atherosclerotic heart disease of native coronary artery without angina pectoris: Secondary | ICD-10-CM | POA: Diagnosis not present

## 2014-11-22 ENCOUNTER — Encounter (HOSPITAL_COMMUNITY)
Admission: RE | Admit: 2014-11-22 | Discharge: 2014-11-22 | Disposition: A | Discharge status: Home / Self Care | Payer: Medicare Other | Source: Ambulatory Visit | Attending: Cardiology | Admitting: Cardiology

## 2014-11-22 DIAGNOSIS — I2510 Atherosclerotic heart disease of native coronary artery without angina pectoris: Secondary | ICD-10-CM | POA: Diagnosis not present

## 2014-11-24 ENCOUNTER — Encounter (HOSPITAL_COMMUNITY)
Admission: RE | Admit: 2014-11-24 | Discharge: 2014-11-24 | Disposition: A | Discharge status: Home / Self Care | Payer: Medicare Other | Source: Ambulatory Visit | Attending: Cardiology | Admitting: Cardiology

## 2014-11-24 DIAGNOSIS — I2510 Atherosclerotic heart disease of native coronary artery without angina pectoris: Secondary | ICD-10-CM | POA: Diagnosis not present

## 2014-11-26 ENCOUNTER — Encounter (HOSPITAL_COMMUNITY)
Admission: RE | Admit: 2014-11-26 | Discharge: 2014-11-26 | Disposition: A | Discharge status: Home / Self Care | Payer: Medicare Other | Source: Ambulatory Visit | Attending: Cardiology | Admitting: Cardiology

## 2014-11-26 DIAGNOSIS — I2510 Atherosclerotic heart disease of native coronary artery without angina pectoris: Secondary | ICD-10-CM | POA: Diagnosis not present

## 2014-11-29 ENCOUNTER — Encounter (HOSPITAL_COMMUNITY)
Admission: RE | Admit: 2014-11-29 | Discharge: 2014-11-29 | Disposition: A | Discharge status: Home / Self Care | Payer: Medicare Other | Source: Ambulatory Visit | Attending: Cardiology | Admitting: Cardiology

## 2014-11-29 DIAGNOSIS — I2510 Atherosclerotic heart disease of native coronary artery without angina pectoris: Secondary | ICD-10-CM | POA: Diagnosis not present

## 2014-12-01 ENCOUNTER — Encounter (HOSPITAL_COMMUNITY)
Admission: RE | Admit: 2014-12-01 | Discharge: 2014-12-01 | Disposition: A | Discharge status: Home / Self Care | Payer: Medicare Other | Source: Ambulatory Visit | Attending: Cardiology | Admitting: Cardiology

## 2014-12-01 DIAGNOSIS — I2510 Atherosclerotic heart disease of native coronary artery without angina pectoris: Secondary | ICD-10-CM | POA: Diagnosis not present

## 2014-12-03 ENCOUNTER — Encounter (HOSPITAL_COMMUNITY)
Admission: RE | Admit: 2014-12-03 | Discharge: 2014-12-03 | Disposition: A | Discharge status: Home / Self Care | Payer: Medicare Other | Source: Ambulatory Visit | Attending: Cardiology | Admitting: Cardiology

## 2014-12-03 DIAGNOSIS — I2510 Atherosclerotic heart disease of native coronary artery without angina pectoris: Secondary | ICD-10-CM | POA: Diagnosis not present

## 2014-12-06 ENCOUNTER — Encounter (HOSPITAL_COMMUNITY)
Admission: RE | Admit: 2014-12-06 | Discharge: 2014-12-06 | Disposition: A | Discharge status: Home / Self Care | Payer: Medicare Other | Source: Ambulatory Visit | Attending: Cardiology | Admitting: Cardiology

## 2014-12-06 DIAGNOSIS — I2510 Atherosclerotic heart disease of native coronary artery without angina pectoris: Secondary | ICD-10-CM | POA: Diagnosis not present

## 2014-12-08 ENCOUNTER — Encounter (HOSPITAL_COMMUNITY)
Admission: RE | Admit: 2014-12-08 | Discharge: 2014-12-08 | Disposition: A | Discharge status: Home / Self Care | Payer: Medicare Other | Source: Ambulatory Visit | Attending: Cardiology | Admitting: Cardiology

## 2014-12-08 DIAGNOSIS — I2510 Atherosclerotic heart disease of native coronary artery without angina pectoris: Secondary | ICD-10-CM | POA: Diagnosis not present

## 2014-12-10 ENCOUNTER — Encounter (HOSPITAL_COMMUNITY)
Admission: RE | Admit: 2014-12-10 | Discharge: 2014-12-10 | Disposition: A | Discharge status: Home / Self Care | Payer: Medicare Other | Source: Ambulatory Visit | Attending: Cardiology | Admitting: Cardiology

## 2014-12-10 DIAGNOSIS — I2510 Atherosclerotic heart disease of native coronary artery without angina pectoris: Secondary | ICD-10-CM | POA: Diagnosis not present

## 2014-12-13 ENCOUNTER — Encounter (HOSPITAL_COMMUNITY)
Admission: RE | Admit: 2014-12-13 | Discharge: 2014-12-13 | Disposition: A | Discharge status: Home / Self Care | Payer: Medicare Other | Source: Ambulatory Visit | Attending: Cardiology | Admitting: Cardiology

## 2014-12-13 DIAGNOSIS — I2510 Atherosclerotic heart disease of native coronary artery without angina pectoris: Secondary | ICD-10-CM | POA: Diagnosis not present

## 2014-12-17 NOTE — Progress Notes (Signed)
Cardiac Rehabilitation Program Outcomes Report   Orientation:  08/31/14 Graduate Date:  12/13/14 Discharge Date:  12/13/14 # of sessions completed: 36  Cardiologist: P SwazilandJordan Family MD:  Nickie Retort Daniel Class Time:  0930  A.  Exercise Program:  Tolerates exercise @ 2.70 METS for 15 minutes and Walk Test Results:  Post: 2.45 mets  B.  Mental Health:  Good mental attitude  C.  Education/Instruction/Skills  Accurately checks own pulse.  Rest:  73  Exercise:  116 and Attended all education classes  Uses Perceived Exertion Scale and/or Dyspnea Scale  D.  Nutrition/Weight Control/Body Composition:  Adherence to prescribed nutrition program: good    E.  Blood Lipids    Lab Results  Component Value Date   CHOL 171 08/10/2014   HDL 42 08/10/2014   LDLCALC 114* 08/10/2014   TRIG 74 08/10/2014   CHOLHDL 4.1 08/10/2014    F.  Lifestyle Changes:  Making positive lifestyle changes  G.  Symptoms noted with exercise:  Asymptomatic  Report Completed By:  Doretha Sou Daylyn Azbill RN   Comments:  This is patients graduation note. Patient has done well in program and stated he feels CR has helped him.

## 2014-12-17 NOTE — Progress Notes (Signed)
Patient is discharged from [facility] today, December 13, 2014 with 36 sessions.  He achieved LTG of 30 minutes of aerobic exercise at max met level of 3.47.  All patient vitals are WNL.  Patient has met with dietician.  Discharge instructions have been reviewed in detail and patient expressed an understanding of material given.  Patient plans to exercise at home. Cardiac Rehab will make 1 month, 6 month and 1 year call backs.  Patient had no complaints of any abnormal S/S or pain on their exit visit.  Patient able to finish post walk test pushing w/c.

## 2015-04-18 ENCOUNTER — Ambulatory Visit (INDEPENDENT_AMBULATORY_CARE_PROVIDER_SITE_OTHER): Payer: Medicare Other | Admitting: Cardiology

## 2015-04-18 ENCOUNTER — Encounter: Payer: Self-pay | Admitting: Cardiology

## 2015-04-18 VITALS — BP 142/54 | HR 73 | Ht 66.0 in | Wt 150.0 lb

## 2015-04-18 DIAGNOSIS — R001 Bradycardia, unspecified: Secondary | ICD-10-CM | POA: Diagnosis not present

## 2015-04-18 DIAGNOSIS — I10 Essential (primary) hypertension: Secondary | ICD-10-CM | POA: Diagnosis not present

## 2015-04-18 DIAGNOSIS — I2510 Atherosclerotic heart disease of native coronary artery without angina pectoris: Secondary | ICD-10-CM | POA: Diagnosis not present

## 2015-04-18 DIAGNOSIS — I25110 Atherosclerotic heart disease of native coronary artery with unstable angina pectoris: Secondary | ICD-10-CM | POA: Diagnosis not present

## 2015-04-18 DIAGNOSIS — E785 Hyperlipidemia, unspecified: Secondary | ICD-10-CM | POA: Diagnosis not present

## 2015-04-18 NOTE — Patient Instructions (Signed)
Try Claritin or Allegra over the counter for your sinus drainage. If this doesn't help then Dr. Reuel Boom may be able to recommend a nasal spray.  Continue your other therapy  I will see you in 6 months.
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
## 2015-11-01 ENCOUNTER — Encounter: Payer: Self-pay | Admitting: Cardiology

## 2015-11-01 ENCOUNTER — Ambulatory Visit (INDEPENDENT_AMBULATORY_CARE_PROVIDER_SITE_OTHER): Payer: Medicare Other | Admitting: Cardiology

## 2015-11-01 VITALS — BP 144/60 | HR 60 | Ht 66.0 in | Wt 150.4 lb

## 2015-11-01 DIAGNOSIS — E785 Hyperlipidemia, unspecified: Secondary | ICD-10-CM

## 2015-11-01 DIAGNOSIS — I25118 Atherosclerotic heart disease of native coronary artery with other forms of angina pectoris: Secondary | ICD-10-CM

## 2015-11-01 DIAGNOSIS — E1165 Type 2 diabetes mellitus with hyperglycemia: Secondary | ICD-10-CM

## 2015-11-01 DIAGNOSIS — I10 Essential (primary) hypertension: Secondary | ICD-10-CM

## 2015-11-01 DIAGNOSIS — IMO0001 Reserved for inherently not codable concepts without codable children: Secondary | ICD-10-CM
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
## 2015-11-01 NOTE — Patient Instructions (Signed)
Continue your current therapy  I will see you in about 6 months   

## 2016-03-12 ENCOUNTER — Inpatient Hospital Stay (HOSPITAL_COMMUNITY)
Admission: AD | Admit: 2016-03-12 | Discharge: 2016-03-19 | DRG: 280 | Disposition: A | Discharge status: Skilled Nursing Facility | Payer: Medicare Other | Source: Other Acute Inpatient Hospital | Attending: Internal Medicine | Admitting: Internal Medicine

## 2016-03-12 ENCOUNTER — Other Ambulatory Visit: Payer: Self-pay

## 2016-03-12 ENCOUNTER — Inpatient Hospital Stay (HOSPITAL_COMMUNITY): Payer: Medicare Other

## 2016-03-12 ENCOUNTER — Encounter (HOSPITAL_COMMUNITY): Payer: Self-pay | Admitting: *Deleted

## 2016-03-12 DIAGNOSIS — Z7982 Long term (current) use of aspirin: Secondary | ICD-10-CM

## 2016-03-12 DIAGNOSIS — Z85828 Personal history of other malignant neoplasm of skin: Secondary | ICD-10-CM | POA: Diagnosis not present

## 2016-03-12 DIAGNOSIS — I480 Paroxysmal atrial fibrillation: Secondary | ICD-10-CM | POA: Diagnosis present

## 2016-03-12 DIAGNOSIS — Z87891 Personal history of nicotine dependence: Secondary | ICD-10-CM | POA: Diagnosis not present

## 2016-03-12 DIAGNOSIS — W1830XA Fall on same level, unspecified, initial encounter: Secondary | ICD-10-CM | POA: Diagnosis present

## 2016-03-12 DIAGNOSIS — G8929 Other chronic pain: Secondary | ICD-10-CM | POA: Diagnosis present

## 2016-03-12 DIAGNOSIS — R630 Anorexia: Secondary | ICD-10-CM | POA: Diagnosis present

## 2016-03-12 DIAGNOSIS — IMO0002 Reserved for concepts with insufficient information to code with codable children: Secondary | ICD-10-CM | POA: Diagnosis present

## 2016-03-12 DIAGNOSIS — J189 Pneumonia, unspecified organism: Secondary | ICD-10-CM | POA: Diagnosis present

## 2016-03-12 DIAGNOSIS — Z955 Presence of coronary angioplasty implant and graft: Secondary | ICD-10-CM

## 2016-03-12 DIAGNOSIS — I4510 Unspecified right bundle-branch block: Secondary | ICD-10-CM | POA: Diagnosis present

## 2016-03-12 DIAGNOSIS — H9190 Unspecified hearing loss, unspecified ear: Secondary | ICD-10-CM | POA: Diagnosis present

## 2016-03-12 DIAGNOSIS — I472 Ventricular tachycardia: Secondary | ICD-10-CM | POA: Diagnosis not present

## 2016-03-12 DIAGNOSIS — Z9981 Dependence on supplemental oxygen: Secondary | ICD-10-CM

## 2016-03-12 DIAGNOSIS — R296 Repeated falls: Secondary | ICD-10-CM | POA: Diagnosis present

## 2016-03-12 DIAGNOSIS — R0789 Other chest pain: Secondary | ICD-10-CM | POA: Diagnosis present

## 2016-03-12 DIAGNOSIS — E876 Hypokalemia: Secondary | ICD-10-CM | POA: Diagnosis not present

## 2016-03-12 DIAGNOSIS — I110 Hypertensive heart disease with heart failure: Secondary | ICD-10-CM | POA: Diagnosis present

## 2016-03-12 DIAGNOSIS — I2510 Atherosclerotic heart disease of native coronary artery without angina pectoris: Secondary | ICD-10-CM | POA: Diagnosis present

## 2016-03-12 DIAGNOSIS — E785 Hyperlipidemia, unspecified: Secondary | ICD-10-CM | POA: Diagnosis present

## 2016-03-12 DIAGNOSIS — Z8249 Family history of ischemic heart disease and other diseases of the circulatory system: Secondary | ICD-10-CM

## 2016-03-12 DIAGNOSIS — R079 Chest pain, unspecified: Secondary | ICD-10-CM

## 2016-03-12 DIAGNOSIS — G4733 Obstructive sleep apnea (adult) (pediatric): Secondary | ICD-10-CM | POA: Diagnosis present

## 2016-03-12 DIAGNOSIS — I252 Old myocardial infarction: Secondary | ICD-10-CM

## 2016-03-12 DIAGNOSIS — J811 Chronic pulmonary edema: Secondary | ICD-10-CM

## 2016-03-12 DIAGNOSIS — Z888 Allergy status to other drugs, medicaments and biological substances status: Secondary | ICD-10-CM | POA: Diagnosis not present

## 2016-03-12 DIAGNOSIS — E1165 Type 2 diabetes mellitus with hyperglycemia: Secondary | ICD-10-CM | POA: Diagnosis present

## 2016-03-12 DIAGNOSIS — Z6824 Body mass index (BMI) 24.0-24.9, adult: Secondary | ICD-10-CM | POA: Diagnosis not present

## 2016-03-12 DIAGNOSIS — Y95 Nosocomial condition: Secondary | ICD-10-CM | POA: Diagnosis present

## 2016-03-12 DIAGNOSIS — F05 Delirium due to known physiological condition: Secondary | ICD-10-CM | POA: Diagnosis present

## 2016-03-12 DIAGNOSIS — R64 Cachexia: Secondary | ICD-10-CM | POA: Diagnosis present

## 2016-03-12 DIAGNOSIS — W19XXXA Unspecified fall, initial encounter: Secondary | ICD-10-CM

## 2016-03-12 DIAGNOSIS — I214 Non-ST elevation (NSTEMI) myocardial infarction: Secondary | ICD-10-CM | POA: Diagnosis present

## 2016-03-12 DIAGNOSIS — I5041 Acute combined systolic (congestive) and diastolic (congestive) heart failure: Secondary | ICD-10-CM | POA: Diagnosis present

## 2016-03-12 DIAGNOSIS — Z79899 Other long term (current) drug therapy: Secondary | ICD-10-CM | POA: Diagnosis not present

## 2016-03-12 DIAGNOSIS — F0390 Unspecified dementia without behavioral disturbance: Secondary | ICD-10-CM | POA: Diagnosis present

## 2016-03-12 DIAGNOSIS — Z833 Family history of diabetes mellitus: Secondary | ICD-10-CM | POA: Diagnosis not present

## 2016-03-12 DIAGNOSIS — R0602 Shortness of breath: Secondary | ICD-10-CM

## 2016-03-12 DIAGNOSIS — Z951 Presence of aortocoronary bypass graft: Secondary | ICD-10-CM | POA: Diagnosis not present

## 2016-03-12 DIAGNOSIS — I10 Essential (primary) hypertension: Secondary | ICD-10-CM | POA: Diagnosis present

## 2016-03-12 DIAGNOSIS — Z9181 History of falling: Secondary | ICD-10-CM

## 2016-03-12 DIAGNOSIS — E118 Type 2 diabetes mellitus with unspecified complications: Secondary | ICD-10-CM | POA: Diagnosis present

## 2016-03-12 DIAGNOSIS — M545 Low back pain: Secondary | ICD-10-CM | POA: Diagnosis present

## 2016-03-12 DIAGNOSIS — R4182 Altered mental status, unspecified: Secondary | ICD-10-CM

## 2016-03-12 LAB — CBC WITH DIFFERENTIAL/PLATELET
BASOS ABS: 0 10*3/uL (ref 0.0–0.1)
Basophils Relative: 0 %
EOS PCT: 0 %
Eosinophils Absolute: 0 10*3/uL (ref 0.0–0.7)
HEMATOCRIT: 38.5 % — AB (ref 39.0–52.0)
Hemoglobin: 13.2 g/dL (ref 13.0–17.0)
LYMPHS ABS: 1.2 10*3/uL (ref 0.7–4.0)
LYMPHS PCT: 7 %
MCH: 31.4 pg (ref 26.0–34.0)
MCHC: 34.3 g/dL (ref 30.0–36.0)
MCV: 91.7 fL (ref 78.0–100.0)
MONO ABS: 0.7 10*3/uL (ref 0.1–1.0)
Monocytes Relative: 4 %
NEUTROS ABS: 14.5 10*3/uL — AB (ref 1.7–7.7)
Neutrophils Relative %: 89 %
PLATELETS: 202 10*3/uL (ref 150–400)
RBC: 4.2 MIL/uL — AB (ref 4.22–5.81)
RDW: 13.6 % (ref 11.5–15.5)
WBC: 16.3 10*3/uL — AB (ref 4.0–10.5)

## 2016-03-12 LAB — ECHOCARDIOGRAM COMPLETE
CHL CUP MV DEC (S): 158
E/e' ratio: 8.57
EWDT: 158 ms
FS: 8 % — AB (ref 28–44)
HEIGHTINCHES: 63 in
IV/PV OW: 0.78
LA ID, A-P, ES: 47 mm
LA diam index: 2.85 cm/m2
LAVOL: 57.6 mL
LAVOLA4C: 49.7 mL
LAVOLIN: 34.9 mL/m2
LEFT ATRIUM END SYS DIAM: 47 mm
LV E/e'average: 8.57
LV PW d: 10.2 mm — AB (ref 0.6–1.1)
LV TDI E'MEDIAL: 4.9
LVEEMED: 8.57
LVELAT: 8.49 cm/s
LVOT area: 2.84 cm2
LVOT diameter: 19 mm
Lateral S' vel: 10.8 cm/s
MV Peak grad: 2 mmHg
MV pk E vel: 72.8 m/s
MVPKAVEL: 44.6 m/s
TAPSE: 17.4 mm
TDI e' lateral: 8.49
WEIGHTICAEL: 2141.11 [oz_av]

## 2016-03-12 LAB — URINALYSIS, ROUTINE W REFLEX MICROSCOPIC
GLUCOSE, UA: 100 mg/dL — AB
KETONES UR: 15 mg/dL — AB
Leukocytes, UA: NEGATIVE
Nitrite: NEGATIVE
PROTEIN: 100 mg/dL — AB
Specific Gravity, Urine: 1.032 — ABNORMAL HIGH (ref 1.005–1.030)
pH: 6 (ref 5.0–8.0)

## 2016-03-12 LAB — URINE MICROSCOPIC-ADD ON

## 2016-03-12 LAB — BASIC METABOLIC PANEL
Anion gap: 10 (ref 5–15)
BUN: 58 mg/dL — AB (ref 6–20)
CHLORIDE: 111 mmol/L (ref 101–111)
CO2: 23 mmol/L (ref 22–32)
CREATININE: 0.97 mg/dL (ref 0.61–1.24)
Calcium: 9 mg/dL (ref 8.9–10.3)
GFR calc Af Amer: >60 mL/min (ref >60)
GFR calc non Af Amer: >60 mL/min (ref >60)
Glucose, Bld: 180 mg/dL — ABNORMAL HIGH (ref 65–99)
POTASSIUM: 3.1 mmol/L — AB (ref 3.5–5.1)
Sodium: 144 mmol/L (ref 135–145)

## 2016-03-12 LAB — LIPID PANEL
CHOL/HDL RATIO: 3.9 ratio
Cholesterol: 165 mg/dL (ref 0–200)
HDL: 42 mg/dL (ref >40)
LDL Cholesterol: 112 mg/dL — ABNORMAL HIGH (ref 0–99)
Triglycerides: 56 mg/dL (ref <150)
VLDL: 11 mg/dL (ref 0–40)

## 2016-03-12 LAB — COMPREHENSIVE METABOLIC PANEL
ALBUMIN: 3.2 g/dL — AB (ref 3.5–5.0)
ALT: 40 U/L (ref 17–63)
AST: 85 U/L — AB (ref 15–41)
Alkaline Phosphatase: 50 U/L (ref 38–126)
Anion gap: 8 (ref 5–15)
BUN: 61 mg/dL — AB (ref 6–20)
CHLORIDE: 111 mmol/L (ref 101–111)
CO2: 25 mmol/L (ref 22–32)
CREATININE: 0.96 mg/dL (ref 0.61–1.24)
Calcium: 9.1 mg/dL (ref 8.9–10.3)
GFR calc Af Amer: >60 mL/min (ref >60)
GLUCOSE: 222 mg/dL — AB (ref 65–99)
POTASSIUM: 3.1 mmol/L — AB (ref 3.5–5.1)
SODIUM: 144 mmol/L (ref 135–145)
Total Bilirubin: 1.3 mg/dL — ABNORMAL HIGH (ref 0.3–1.2)
Total Protein: 6 g/dL — ABNORMAL LOW (ref 6.5–8.1)

## 2016-03-12 LAB — MRSA PCR SCREENING: MRSA by PCR: NEGATIVE

## 2016-03-12 LAB — PROTIME-INR
INR: 1.5 — ABNORMAL HIGH (ref 0.00–1.49)
Prothrombin Time: 18.2 seconds — ABNORMAL HIGH (ref 11.6–15.2)

## 2016-03-12 LAB — LACTIC ACID, PLASMA: LACTIC ACID, VENOUS: 2.2 mmol/L — AB (ref 0.5–2.0)

## 2016-03-12 LAB — HEPARIN LEVEL (UNFRACTIONATED)
Heparin Unfractionated: 0.63 IU/mL (ref 0.30–0.70)
Heparin Unfractionated: 0.38 IU/mL (ref 0.30–0.70)

## 2016-03-12 LAB — BRAIN NATRIURETIC PEPTIDE: B NATRIURETIC PEPTIDE 5: 1688 pg/mL — AB (ref 0.0–100.0)

## 2016-03-12 LAB — MAGNESIUM: Magnesium: 1.9 mg/dL (ref 1.7–2.4)

## 2016-03-12 LAB — TROPONIN I
TROPONIN I: 3.25 ng/mL — AB (ref <0.031)
TROPONIN I: 2.27 ng/mL — AB (ref <0.031)
Troponin I: 4.34 ng/mL (ref <0.031)

## 2016-03-12 LAB — TSH: TSH: 0.685 u[IU]/mL (ref 0.350–4.500)

## 2016-03-12 LAB — AMMONIA: Ammonia: 30 umol/L (ref 9–35)

## 2016-03-12 MED ORDER — ADULT MULTIVITAMIN W/MINERALS CH
1.0000 | ORAL_TABLET | Freq: Every day | ORAL | Status: DC
  Administered 2016-03-12 – 2016-03-19 (×8): 1 via ORAL
  Filled 2016-03-12 (×8): qty 1

## 2016-03-12 MED ORDER — PIPERACILLIN-TAZOBACTAM 3.375 G IVPB
3.3750 g | Freq: Three times a day (TID) | INTRAVENOUS | Status: DC
  Administered 2016-03-12 – 2016-03-19 (×21): 3.375 g via INTRAVENOUS
  Filled 2016-03-12 (×27): qty 50

## 2016-03-12 MED ORDER — IPRATROPIUM BROMIDE 0.06 % NA SOLN
1.0000 | Freq: Two times a day (BID) | NASAL | Status: DC
  Administered 2016-03-12 – 2016-03-19 (×12): 1 via NASAL
  Filled 2016-03-12: qty 15

## 2016-03-12 MED ORDER — AMIODARONE LOAD VIA INFUSION
150.0000 mg | Freq: Once | INTRAVENOUS | Status: AC
  Administered 2016-03-12: 150 mg via INTRAVENOUS
  Filled 2016-03-12: qty 83.34

## 2016-03-12 MED ORDER — NITROGLYCERIN 0.4 MG SL SUBL: 0.4000 mg | SUBLINGUAL_TABLET | SUBLINGUAL | Status: DC | PRN

## 2016-03-12 MED ORDER — SODIUM CHLORIDE 0.9 % IV SOLN: 250.0000 mL | INTRAVENOUS | Status: DC | PRN

## 2016-03-12 MED ORDER — ASPIRIN 81 MG PO TABS: 81.0000 mg | ORAL_TABLET | Freq: Every day | ORAL | Status: DC

## 2016-03-12 MED ORDER — CETYLPYRIDINIUM CHLORIDE 0.05 % MT LIQD
7.0000 mL | Freq: Two times a day (BID) | OROMUCOSAL | Status: DC
  Administered 2016-03-12 – 2016-03-19 (×15): 7 mL via OROMUCOSAL

## 2016-03-12 MED ORDER — PIPERACILLIN-TAZOBACTAM 3.375 G IVPB 30 MIN
3.3750 g | Freq: Once | INTRAVENOUS | Status: AC
  Administered 2016-03-12: 3.375 g via INTRAVENOUS
  Filled 2016-03-12: qty 50

## 2016-03-12 MED ORDER — ONDANSETRON HCL 4 MG/2ML IJ SOLN: 4.0000 mg | Freq: Four times a day (QID) | INTRAMUSCULAR | Status: DC | PRN

## 2016-03-12 MED ORDER — VANCOMYCIN HCL 500 MG IV SOLR
500.0000 mg | Freq: Two times a day (BID) | INTRAVENOUS | Status: DC
  Administered 2016-03-12 – 2016-03-14 (×4): 500 mg via INTRAVENOUS
  Filled 2016-03-12 (×5): qty 500

## 2016-03-12 MED ORDER — ACETAMINOPHEN 325 MG PO TABS
650.0000 mg | ORAL_TABLET | ORAL | Status: DC | PRN
  Administered 2016-03-14: 650 mg via ORAL
  Filled 2016-03-12: qty 2

## 2016-03-12 MED ORDER — VANCOMYCIN HCL IN DEXTROSE 1-5 GM/200ML-% IV SOLN
1000.0000 mg | Freq: Once | INTRAVENOUS | Status: AC
  Administered 2016-03-12: 1000 mg via INTRAVENOUS
  Filled 2016-03-12: qty 200

## 2016-03-12 MED ORDER — SODIUM CHLORIDE 0.9% FLUSH: 3.0000 mL | INTRAVENOUS | Status: DC | PRN

## 2016-03-12 MED ORDER — AMIODARONE HCL IN DEXTROSE 360-4.14 MG/200ML-% IV SOLN
30.0000 mg/h | INTRAVENOUS | Status: DC
  Administered 2016-03-13 – 2016-03-16 (×6): 30 mg/h via INTRAVENOUS
  Filled 2016-03-12 (×7): qty 200

## 2016-03-12 MED ORDER — ASPIRIN EC 81 MG PO TBEC
81.0000 mg | DELAYED_RELEASE_TABLET | Freq: Every day | ORAL | Status: DC
  Administered 2016-03-12 – 2016-03-19 (×8): 81 mg via ORAL
  Filled 2016-03-12 (×8): qty 1

## 2016-03-12 MED ORDER — SODIUM CHLORIDE 0.9% FLUSH
3.0000 mL | Freq: Two times a day (BID) | INTRAVENOUS | Status: DC
  Administered 2016-03-12 – 2016-03-17 (×10): 3 mL via INTRAVENOUS

## 2016-03-12 MED ORDER — PROSTATE PO TABS: ORAL_TABLET | Freq: Three times a day (TID) | ORAL | Status: DC

## 2016-03-12 MED ORDER — ATORVASTATIN CALCIUM 80 MG PO TABS
80.0000 mg | ORAL_TABLET | Freq: Every day | ORAL | Status: DC
  Administered 2016-03-12 – 2016-03-18 (×7): 80 mg via ORAL
  Filled 2016-03-12 (×7): qty 1

## 2016-03-12 MED ORDER — HEPARIN (PORCINE) IN NACL 100-0.45 UNIT/ML-% IJ SOLN
1150.0000 [IU]/h | INTRAMUSCULAR | Status: DC
  Administered 2016-03-12: 1000 [IU]/h via INTRAVENOUS
  Filled 2016-03-12 (×2): qty 250

## 2016-03-12 MED ORDER — AMIODARONE HCL IN DEXTROSE 360-4.14 MG/200ML-% IV SOLN
60.0000 mg/h | INTRAVENOUS | Status: AC
  Administered 2016-03-12 (×2): 60 mg/h via INTRAVENOUS
  Filled 2016-03-12 (×2): qty 200

## 2016-03-12 MED ORDER — VITAMIN D 1000 UNITS PO TABS
1000.0000 [IU] | ORAL_TABLET | Freq: Every day | ORAL | Status: DC
  Administered 2016-03-12 – 2016-03-19 (×8): 1000 [IU] via ORAL
  Filled 2016-03-12 (×8): qty 1

## 2016-03-12 MED ORDER — HYPROMELLOSE (GONIOSCOPIC) 2.5 % OP SOLN
1.0000 [drp] | Freq: Two times a day (BID) | OPHTHALMIC | Status: DC
  Administered 2016-03-12 – 2016-03-19 (×15): 1 [drp] via OPHTHALMIC
  Filled 2016-03-12: qty 15

## 2016-03-12 MED ORDER — VITAMIN B-12 1000 MCG PO TABS
1000.0000 ug | ORAL_TABLET | Freq: Every day | ORAL | Status: DC
  Administered 2016-03-12 – 2016-03-18 (×7): 1000 ug via ORAL
  Filled 2016-03-12 (×7): qty 1

## 2016-03-12 MED FILL — Heparin Sodium (Porcine) 100 Unt/ML in Sodium Chloride 0.45%: INTRAMUSCULAR | Qty: 250 | Status: AC

## 2016-03-12 NOTE — Progress Notes (Signed)
Patient Name: Tommy Patterson Date of Encounter: 03/12/2016  Primary Cardiologist: Dr. SwazilandJordan   Principal Problem:   NSTEMI (non-ST elevated myocardial infarction) [facility](HCC) Active Problems:   Coronary artery disease   Hypertension   Dyslipidemia   Diabetes mellitus type 2, uncontrolled (HCC)   Acute confusional state    SUBJECTIVE  Denies any CP, does have some SOB.   CURRENT MEDS . antiseptic oral rinse  7 mL Mouth Rinse BID  . aspirin EC  81 mg Oral Daily  . atorvastatin  80 mg Oral q1800  . cholecalciferol  1,000 Units Oral Daily  . hydroxypropyl methylcellulose / hypromellose  1 drop Both Eyes BID  . ipratropium  1 spray Each Nare BID  . multivitamin with minerals  1 tablet Oral Daily  . piperacillin-tazobactam (ZOSYN)  IV  3.375 g Intravenous Q8H  . sodium chloride flush  3 mL Intravenous Q12H  . vancomycin  500 mg Intravenous Q12H  . vitamin B-12  1,000 mcg Oral Daily    OBJECTIVE  Filed Vitals:   03/12/16 0400 03/12/16 0500 03/12/16 0600 03/12/16 0700  BP: 106/82 97/58 131/67 111/93  Pulse: 96 75 145 97  Temp:      TempSrc:      Resp: 23 19 20 23   Height:      Weight:  133 lb 13.1 oz (60.7 kg)    SpO2: 94% 95% 77% 95%    Intake/Output Summary (Last 24 hours) at 03/12/16 0754 Last data filed at 03/12/16 0600  Gross per 24 hour  Intake  232.5 ml  Output      1 ml  Net  231.5 ml   Filed Weights   03/12/16 0047 03/12/16 0500  Weight: 133 lb 13.1 oz (60.7 kg) 133 lb 13.1 oz (60.7 kg)    PHYSICAL EXAM  General: Pleasant, NAD. Neuro: Alert and oriented. Moderately demented, however able to answer most question if asked repeatedly. Moves all extremities spontaneously. Hands in [place] Psych: Normal affect. HEENT:  Normal  Neck: Supple without bruits or JVD. Lungs:  Resp regular and unlabored, anterior exam CTA. Heart: RRR no s3, s4, or murmurs. Abdomen: Soft, non-tender, non-distended, BS + x 4.  Extremities: No clubbing, cyanosis or edema.  DP/PT/Radials 2+ and equal bilaterally.  Accessory Clinical Findings  CBC  Recent Labs  03/12/16 0223  WBC 16.3*  NEUTROABS 14.5*  HGB 13.2  HCT 38.5*  MCV 91.7  PLT 202   Basic Metabolic Panel  Recent Labs  03/12/16 0223  NA 144  K 3.1*  CL 111  CO2 25  GLUCOSE 222*  BUN 61*  CREATININE 0.96  CALCIUM 9.1  MG 1.9   Liver Function Tests  Recent Labs  03/12/16 0223  AST 85*  ALT 40  ALKPHOS 50  BILITOT 1.3*  PROT 6.0*  ALBUMIN 3.2*   Cardiac Enzymes  Recent Labs  03/12/16 0223  TROPONINI 4.34*   Fasting Lipid Panel  Recent Labs  03/12/16 0232  CHOL 165  HDL 42  LDLCALC 112*  TRIG 56  CHOLHDL 3.9   Thyroid Function Tests  Recent Labs  03/12/16 0232  TSH 0.685    TELE 37 beats run of NSVT, otherwise NSR    ECG  NSR without obvious ST-T wave changes, RBBB  Echocardiogram  pending    Radiology/Studies  No results found.  ASSESSMENT AND PLAN  [name] is an 82 yo man with PMH of CAD s/p CABG '02, prior PCI of RCA 08/2010 with DES,  s/p POBA of OM1 08/2014, T2DM, hypertension and dyslipidemia, intolerant of beta-blockers given bradycardia last seen by Dr. SwazilandJordan February 2017 who [facility] and found to have NSTEMI. His son found him on the floor  1. NSTEMI  - amlodipine, imdur, and losartan held due to low BP. BP improved after holding meds. H/o intolerance to BB due to bradycardia  - unclear if related to fall, EKG no obvious ST elevation, no change compare to previous EKG  - trop 0.74 [facility], last trop 4.34 here  - will check CKMB and total CK given fall. [facility]. Trend trop. Obtain echo  - He denies any CP today or yesterday, given his mental status, I do not think he is a good candidate for invasive intervention. Will discuss with MD. Given lack of chest pain but positive trop, will continue IV heparin for now. Note he had 37 beats run of NSVT last night,  despite h/o bradycardia with BB, I wonder if we can add 3.125mg  BID coreg.   - will need to discuss with family regarding treatment and goal of care once they arrive  2. Fall: patient has dementia, although appears to follow instructions. Unclear how much of this is acute delirium.  - per patient he remember falling yesterday and was on the floor for 2-3 hours. Given elevated trop, will check total CK  - CT head and neck no acute process  3. Altered Mental status/confusion - delirium vs. Dementia  4. ? PNA at OSH (have not found hospital records)? PNA at OSH (have not found hospital records)  - currently on Vancomycin and zosyn, if blood cultures come back negative, will d/c abx  - I am unable to locate [facility]   5. CAD  - PCI/stent of the RCA in December of 2011 with DES. He is s/p POBA of OM1 in December 2015  6. HTN  7. HLD  8. DM II  9. NSVT: he had 37 beats run of ventricular ectopy/NSVT last night  Signed, Azalee CourseHao Meng PA-C Pager: [phone]  Patient is delerious no chest pain Not clear to me why he was transferred to cardiology service ECG is non acute with advanced age and dementia not a candidate for aggressive invasive Evaluation . Should be on hospitalist service continue antibiotics for now Check CPK Give fall and possible rhabdo  Tommy Patterson

## 2016-03-12 NOTE — Progress Notes (Signed)
  Echocardiogram 2D Echocardiogram has been performed.  Tommy Patterson, Tommy Patterson 03/12/2016, 5:05 PM

## 2016-03-12 NOTE — Progress Notes (Signed)
  Discussed case with cardiology PA, Lisabeth RegisterHans.   82 year old male presenting [facility] for concern for an NSTEMI and confusion. Patient with a known history of CAD status post cardiac catheterization and CABG, HTN, HLD, DM, OSA. Patient's troponin continues to clim (currently 4.34). Of note patient is on chronic O2 no no formal diagnoses of COPD or other chronic pulmonary process. Per cardiology he is not a catheterization candidate at this point time and they're pursuing medical management of his apparent ACS. Patient is very complex and [facility] will assume care of patient on 03/13/2016.  Given  additional recommendations for treatment plan on 03/12/2016 as it appears that patient needs additional workup at this time - including chest x-ray, ammonia, lactic acid. Consider CT head  Agree with social work consult and would strongly also recommend palliative care consult for goals of care discussions.  Please feel free to call or page with additional questions or assistance  Shelly Flattenavid Merrell, MD [facility] 03/12/2016, 9:44 AM
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
## 2016-03-12 NOTE — Progress Notes (Signed)
CRITICAL VALUE ALERT  Critical value received:  Lactic Acid 2.2  Date of notification:  03/12/2016  Time of notification: 03/12/2016  Critical value read back:Yes.    Nurse who received alert:  Suzzette RighterErin Smith, RN  MD notified (1st page):  Azalee CourseHao Meng, [place]  Time of first page:  1419  MD notified (2nd page):  Time of second page:  Responding MD:  Azalee CourseHao Meng, PA  Time MD responded:  1420

## 2016-03-12 NOTE — Care Management Important Message (Signed)
Important Message  Patient Details  Name: Tommy HaberJohn Oliver Patterson MRN: [id number] Date of Birth: 11-15-1923   Medicare Important Message Given:  Yes    Sidnee Gambrill, Stephan MinisterSusan Coleman 03/12/2016, 8:45 AM
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
## 2016-03-12 NOTE — Progress Notes (Addendum)
ANTICOAGULATION CONSULT NOTE - Follow Up Consult  Pharmacy Consult for heparin  Indication: chest pain/ACS  Allergies  Allergen Reactions  . Prednisone     increases appetite    Patient Measurements: Height: 5\' 3"  (160 cm) Weight: 133 lb 13.1 oz (60.7 kg) IBW/kg (Calculated) : 56.9  Vital Signs: Temp: 99 F (37.2 C) (06/26 0759) Temp Source: Oral (06/26 0759) BP: 111/93 mmHg (06/26 0759) Pulse Rate: 92 (06/26 0759)  Labs:  Recent Labs  03/12/16 0223 03/12/16 0232 03/12/16 0804  HGB 13.2  --   --   HCT 38.5*  --   --   PLT 202  --   --   LABPROT 18.2*  --   --   INR 1.50*  --   --   HEPARINUNFRC  --  0.63  --   CREATININE 0.96  --  0.97  TROPONINI 4.34*  --  3.25*    Estimated Creatinine Clearance: 41.6 mL/min (by C-G formula based on Cr of 0.97).   Medications:  Infusions:  . heparin 1,000 Units/hr (03/12/16 0600)    Assessment: 82 [facility] Pharmacy dosing heparin for NSTEMI. Heparin is at goal (HL= 0.62 on 1000 units/hr). No cath plans noted at this time.   Goal of Therapy:  Heparin level 0.3-0.7 units/ml Monitor platelets by anticoagulation protocol: Yes   Plan:  -No heparin changes needed -Heparin level in 8 hrs to confirm -Daily heparin level and CBC  Harland GermanAndrew Apoorva Bugay, Pharm D 03/12/2016 10:34 AM   Addendum -heparin level confirmed at goal (HL= 0.38)  Plan -no heparin changes needed  Harland Germanndrew Lakara Weiland, Pharm D 03/12/2016 1:56 PM

## 2016-03-13 DIAGNOSIS — I480 Paroxysmal atrial fibrillation: Secondary | ICD-10-CM | POA: Diagnosis present

## 2016-03-13 DIAGNOSIS — R296 Repeated falls: Secondary | ICD-10-CM | POA: Diagnosis present

## 2016-03-13 DIAGNOSIS — J189 Pneumonia, unspecified organism: Secondary | ICD-10-CM | POA: Diagnosis present

## 2016-03-13 DIAGNOSIS — I2510 Atherosclerotic heart disease of native coronary artery without angina pectoris: Secondary | ICD-10-CM | POA: Diagnosis present

## 2016-03-13 LAB — CBC
HCT: 37.6 % — ABNORMAL LOW (ref 39.0–52.0)
Hemoglobin: 12.6 g/dL — ABNORMAL LOW (ref 13.0–17.0)
MCH: 30.3 pg (ref 26.0–34.0)
MCHC: 33.5 g/dL (ref 30.0–36.0)
MCV: 90.4 fL (ref 78.0–100.0)
PLATELETS: 187 10*3/uL (ref 150–400)
RBC: 4.16 MIL/uL — ABNORMAL LOW (ref 4.22–5.81)
RDW: 13.7 % (ref 11.5–15.5)
WBC: 13.2 10*3/uL — AB (ref 4.0–10.5)

## 2016-03-13 LAB — LACTIC ACID, PLASMA: Lactic Acid, Venous: 1.8 mmol/L (ref 0.5–1.9)

## 2016-03-13 LAB — HEMOGLOBIN A1C
HEMOGLOBIN A1C: 6.9 % — AB (ref 4.8–5.6)
MEAN PLASMA GLUCOSE: 151 mg/dL

## 2016-03-13 LAB — HEPARIN LEVEL (UNFRACTIONATED): HEPARIN UNFRACTIONATED: 0.33 [IU]/mL (ref 0.30–0.70)

## 2016-03-13 NOTE — Progress Notes (Signed)
PROGRESS NOTE    Tommy HaberJohn Oliver Patterson  ZOX:[id number] DOB: 06-Nov-1923 DOA: 03/12/2016 PCP: Tommy SprungANIEL, Tommy Patterson   Brief Narrative:  82 yo WM PMHx of CAD s/p CABG '02, prior PCI of RCA 08/2010 with DES, s/p POBA of OM1 08/2014, T2DM, hypertension and dyslipidemia, intolerant of beta-blockers given bradycardia last seen by Tommy Patterson February 2017 who [facility] with chest pain and found to have troponin of 0.7 consistent with NSTEMI leading to transfer to [facility]. On arrival, he was noted to be hard of hearing. He knows who he is but he is a bit confused - knows hospital. His son came in and said he found him on the floor earlier tonight and said he had fallen a few times in the last week to his knowledge. He says that 2 weeks ago he passed a dementia test with "flying colors" however he is not acting like himself now. His son says he has not complained of any chest pain.    Assessment & Plan:   Principal Problem:   NSTEMI (non-ST elevated myocardial infarction) (HCC) Active Problems:   Coronary artery disease   Hypertension   Dyslipidemia   Diabetes mellitus type 2, uncontrolled (HCC)   Acute confusional state   Frequent falls   CAP (community acquired pneumonia)   Paroxysmal atrial fibrillation (HCC)   CAD in native artery    NSTEMI - amlodipine, imdur, and losartan held due to low BP. BP improved after holding meds. H/o intolerance to BB due to bradycardia - unclear if related to fall, EKG no obvious ST elevation, no change compare to previous EKG RBBB - trop 0.74 --> [facility], last trop 4.34 --> 3.25 --> 2.27. Total CK and CK MB ordered yesterday, but cancelled by lab for unknown reason. [facility] CPK 918 [facility].  - trop trending down. Echo 03/12/2016 EF 25-30%, grade 2 DD, mild MR/TR - Not a good candidate for invasive intervention.  -PT/OT consult placed  Fall:  -patient has dementia/AMS,  - CT head and neck no acute process - AMS  resolved  PNA at OSH - (have not found hospital records)? PNA at OSH (have not found hospital records) -Given patient's frail condition would complete 5 day course of antibiotic -Unable to locate [facility] x [facility].  -PCXR on 6/28  PAF - newly diagnosed( CHA2DS2-Vasc score 6) - (HTN, DM, age, CAD, HF) - spontaneously went into afib RVR in PM of 6/26, started on IV amiodarone, converted to NSR; currently in NSR  -Not good candidate for long-term anticoagulation. Would DC heparin drip in a.m.  CAD native artery - PCI/stent of the RCA in December of 2011 with DES. He is s/p POBA of OM1 in December 2015  Goals of care -Patient A/O 4 agrees that he needs to move in with one of his sons. States both sons have offered to have him move in with them. Consult social work to arrange family meeting to discuss discharge arrangement  DVT prophylaxis: Heparin drip Code Status: Full Family Communication: None Disposition Plan: Patient has agreed to stay with one of his sons. CSW to arrange family meeting to discuss discharge arrangements   Consultants:  Cardiology  Procedures/Significant Events:  None  Cultures 6/26 blood left AC/right hand NGTD  Antimicrobials: Zosyn 6/26>> Vancomycin 6/26>>   Devices    LINES / TUBES:      Continuous Infusions: . amiodarone 30 mg/hr (03/13/16 1800)  . heparin 1,000 Units/hr (03/13/16 1800)  Subjective: 6/27  A/O 4, NAD, states has had multiple falls believes it is time to move in with one of his sons who have offered in the past.   Objective: Filed Vitals:   03/13/16 1700 03/13/16 1800 03/13/16 1949 03/13/16 2000  BP:  111/64  126/90  Pulse: 87 85 85 84  Temp:   98.9 F (37.2 C)   TempSrc:   Oral   Resp: 17 23 21 25   Height:      Weight:      SpO2: 98% 97% 96% 96%    Intake/Output Summary (Last 24 hours) at 03/13/16 2121 Last data filed at 03/13/16 2100  Gross per 24 hour  Intake 1164.89 ml  Output    350  ml  Net 814.89 ml   Filed Weights   03/12/16 0047 03/12/16 0500 03/13/16 0336  Weight: 60.7 kg (133 lb 13.1 oz) 60.7 kg (133 lb 13.1 oz) 60.5 kg (133 lb 6.1 oz)    Examination:  General: A/O 4, NAD, No acute respiratory distress Eyes: negative scleral hemorrhage, negative anisocoria, negative icterus ENT: Negative Runny nose, negative gingival bleeding, Neck:  Negative scars, masses, torticollis, lymphadenopathy, JVD Lungs: Clear to auscultation bilaterally without wheezes or crackles Cardiovascular: Regular rate and rhythm without murmur gallop or rub normal S1 and S2 Abdomen: negative abdominal pain, nondistended, positive soft, bowel sounds, no rebound, no ascites, no appreciable mass Extremities: No significant cyanosis, clubbing, or edema bilateral lower extremities Skin: Negative rashes, lesions, ulcers Psychiatric:  Negative depression, negative anxiety, negative fatigue, negative mania  Central nervous system:  Cranial nerves II through XII intact, tongue/uvula midline, all extremities muscle strength 5/5, sensation intact throughout, negative dysarthria, negative expressive aphasia, negative receptive aphasia.  .     Data Reviewed: Care during the described time interval was provided by me .  I have reviewed this patient's available data, including medical history, events of note, physical examination, and all test results as part of my evaluation. I have personally reviewed and interpreted all radiology studies.  CBC:  Recent Labs Lab 03/12/16 0223 03/13/16 0400  WBC 16.3* 13.2*  NEUTROABS 14.5*  --   HGB 13.2 12.6*  HCT 38.5* 37.6*  MCV 91.7 90.4  PLT 202 187   Basic Metabolic Panel:  Recent Labs Lab 03/12/16 0223 03/12/16 0804  NA 144 144  K 3.1* 3.1*  CL 111 111  CO2 25 23  GLUCOSE 222* 180*  BUN 61* 58*  CREATININE 0.96 0.97  CALCIUM 9.1 9.0  MG 1.9  --    GFR: Estimated Creatinine Clearance: 41.6 mL/min (by C-G formula based on Cr of  0.97). Liver Function Tests:  Recent Labs Lab 03/12/16 0223  AST 85*  ALT 40  ALKPHOS 50  BILITOT 1.3*  PROT 6.0*  ALBUMIN 3.2*   No results for input(s): LIPASE, AMYLASE in the last 168 hours.  Recent Labs Lab 03/12/16 1250  AMMONIA 30   Coagulation Profile:  Recent Labs Lab 03/12/16 0223  INR 1.50*   Cardiac Enzymes:  Recent Labs Lab 03/12/16 0223 03/12/16 0804 03/12/16 1511  TROPONINI 4.34* 3.25* 2.27*   BNP (last 3 results) No results for input(s): PROBNP in the last 8760 hours. HbA1C:  Recent Labs  03/12/16 0232  HGBA1C 6.9*   CBG: No results for input(s): GLUCAP in the last 168 hours. Lipid Profile:  Recent Labs  03/12/16 0232  CHOL 165  HDL 42  LDLCALC 112*  TRIG 56  CHOLHDL 3.9   Thyroid Function Tests:  Recent Labs  03/12/16 0232  TSH 0.685   Anemia Panel: No results for input(s): VITAMINB12, FOLATE, FERRITIN, TIBC, IRON, RETICCTPCT in the last 72 hours. Urine analysis:    Component Value Date/Time   COLORURINE AMBER* 03/12/2016 0248   APPEARANCEUR CLOUDY* 03/12/2016 0248   LABSPEC 1.032* 03/12/2016 0248   PHURINE 6.0 03/12/2016 0248   GLUCOSEU 100* 03/12/2016 0248   HGBUR LARGE* 03/12/2016 0248   BILIRUBINUR SMALL* 03/12/2016 0248   KETONESUR 15* 03/12/2016 0248   PROTEINUR 100* 03/12/2016 0248   NITRITE NEGATIVE 03/12/2016 0248   LEUKOCYTESUR NEGATIVE 03/12/2016 0248   Sepsis Labs: (procalcitonin:4,lacticidven:4)  ) Recent Results (from the past 240 hour(s))  MRSA PCR Screening     Status: None   Collection Time: 03/12/16 12:49 AM  Result Value Ref Range Status   MRSA by PCR NEGATIVE NEGATIVE Final    Comment:        The GeneXpert MRSA Assay (FDA approved for NASAL specimens only), is one component of a comprehensive MRSA colonization surveillance program. It is not intended to diagnose MRSA infection nor to guide or monitor treatment for MRSA infections.   Culture, blood (routine x 2)      Status: None (Preliminary result)   Collection Time: 03/12/16  2:32 AM  Result Value Ref Range Status   Specimen Description BLOOD LEFT ANTECUBITAL  Final   Special Requests BOTTLES DRAWN AEROBIC AND ANAEROBIC 10CC   Final   Culture NO GROWTH 1 DAY  Final   Report Status PENDING  Incomplete  Culture, blood (routine x 2)     Status: None (Preliminary result)   Collection Time: 03/12/16  2:38 AM  Result Value Ref Range Status   Specimen Description BLOOD RIGHT HAND  Final   Special Requests IN PEDIATRIC BOTTLE 1CC  Final   Culture NO GROWTH 1 DAY  Final   Report Status PENDING  Incomplete         Radiology Studies: Dg Chest Port 1 View  03/12/2016  CLINICAL DATA:  Shortness of breath. EXAM: PORTABLE CHEST 1 VIEW COMPARISON:  Radiograph of March 11, 2016. FINDINGS: Stable cardiomediastinal silhouette. Status post Coronary artery bypass graft. Increased bilateral perihilar and basilar interstitial densities are noted concerning for edema. No definite pneumothorax is noted. Minimal bilateral pleural effusions are noted. Bony thorax is unremarkable. IMPRESSION: Increased bilateral perihilar and basilar interstitial densities are noted concerning for edema with minimal pleural effusions. Electronically Signed   By: Lupita Raider, M.D.   On: 03/12/2016 11:37        Scheduled Meds: . antiseptic oral rinse  7 mL Mouth Rinse BID  . aspirin EC  81 mg Oral Daily  . atorvastatin  80 mg Oral q1800  . cholecalciferol  1,000 Units Oral Daily  . hydroxypropyl methylcellulose / hypromellose  1 drop Both Eyes BID  . ipratropium  1 spray Each Nare BID  . multivitamin with minerals  1 tablet Oral Daily  . piperacillin-tazobactam (ZOSYN)  IV  3.375 g Intravenous Q8H  . sodium chloride flush  3 mL Intravenous Q12H  . vancomycin  500 mg Intravenous Q12H  . vitamin B-12  1,000 mcg Oral Daily   Continuous Infusions: . amiodarone 30 mg/hr (03/13/16 1800)  . heparin 1,000 Units/hr (03/13/16 1800)      LOS: 1 day    Time spent: 40 minutes    WOODS, Roselind Messier, Patterson [facility] Pager [phone]   If 7PM-7AM, please contact night-coverage [email or web address] Password [facility] Fall 03/13/2016, 9:21 PM

## 2016-03-13 NOTE — Progress Notes (Signed)
Patient converted to Afib with a heart rate in the 130s-140s. Heart rate varying between 110-140s. Jearld PiesMcClean, MD called and notified. Verbal orders to start Amiodarone. Will initiate orders and continue to monitor patient.

## 2016-03-13 NOTE — Progress Notes (Signed)
Patient Name: Tommy Patterson Date of Encounter: 03/13/2016  Primary Cardiologist: Dr. SwazilandJordan   Principal Problem:   NSTEMI (non-ST elevated myocardial infarction) [facility](HCC) Active Problems:   Coronary artery disease   Hypertension   Dyslipidemia   Diabetes mellitus type 2, uncontrolled (HCC)   Acute confusional state    SUBJECTIVE  Denies any CP or SOB  CURRENT MEDS . antiseptic oral rinse  7 mL Mouth Rinse BID  . aspirin EC  81 mg Oral Daily  . atorvastatin  80 mg Oral q1800  . cholecalciferol  1,000 Units Oral Daily  . hydroxypropyl methylcellulose / hypromellose  1 drop Both Eyes BID  . ipratropium  1 spray Each Nare BID  . multivitamin with minerals  1 tablet Oral Daily  . piperacillin-tazobactam (ZOSYN)  IV  3.375 g Intravenous Q8H  . sodium chloride flush  3 mL Intravenous Q12H  . vancomycin  500 mg Intravenous Q12H  . vitamin B-12  1,000 mcg Oral Daily    OBJECTIVE  Filed Vitals:   03/13/16 0200 03/13/16 0336 03/13/16 0400 03/13/16 0600  BP: 109/72  98/58 101/52  Pulse: 81 77 71 62  Temp:  97.4 F (36.3 C)    TempSrc:  Oral    Resp: 22 15 20 19   Height:      Weight:  133 lb 6.1 oz (60.5 kg)    SpO2: 100% 95% 99% 100%    Intake/Output Summary (Last 24 hours) at 03/13/16 0746 Last data filed at 03/13/16 0700  Gross per 24 hour  Intake 1016.59 ml  Output    750 ml  Net 266.59 ml   Filed Weights   03/12/16 0047 03/12/16 0500 03/13/16 0336  Weight: 133 lb 13.1 oz (60.7 kg) 133 lb 13.1 oz (60.7 kg) 133 lb 6.1 oz (60.5 kg)    PHYSICAL EXAM  General: Pleasant, NAD. Neuro: Alert and orientedx3 (president, year, location). Moderately demented, however able to answer most question if asked repeatedly. Moves all extremities spontaneously. Hands in [place] Psych: Normal affect. HEENT:  Normal  Neck: Supple without bruits or JVD. Lungs:  Resp regular and unlabored, anterior exam CTA. Heart: RRR no s3, s4, or murmurs. Abdomen: Soft, non-tender,  non-distended, BS + x 4.  Extremities: No clubbing, cyanosis or edema. DP/PT/Radials 2+ and equal bilaterally.  Accessory Clinical Findings  CBC  Recent Labs  03/12/16 0223 03/13/16 0400  WBC 16.3* 13.2*  NEUTROABS 14.5*  --   HGB 13.2 12.6*  HCT 38.5* 37.6*  MCV 91.7 90.4  PLT 202 187   Basic Metabolic Panel  Recent Labs  03/12/16 0223 03/12/16 0804  NA 144 144  K 3.1* 3.1*  CL 111 111  CO2 25 23  GLUCOSE 222* 180*  BUN 61* 58*  CREATININE 0.96 0.97  CALCIUM 9.1 9.0  MG 1.9  --    Liver Function Tests  Recent Labs  03/12/16 0223  AST 85*  ALT 40  ALKPHOS 50  BILITOT 1.3*  PROT 6.0*  ALBUMIN 3.2*   Cardiac Enzymes  Recent Labs  03/12/16 0223 03/12/16 0804 03/12/16 1511  TROPONINI 4.34* 3.25* 2.27*   Fasting Lipid Panel  Recent Labs  03/12/16 0232  CHOL 165  HDL 42  LDLCALC 112*  TRIG 56  CHOLHDL 3.9   Thyroid Function Tests  Recent Labs  03/12/16 0232  TSH 0.685    TELE afib with RVR last night, currently in sinus tach    ECG  NSR with ST depression in inferior leads  Echocardiogram 03/12/2016  - Left ventricle: The cavity size was normal. Systolic function was  severely reduced. The estimated ejection fraction was in the  range of 25% to 30%. Features are consistent with a pseudonormal  left ventricular filling pattern, with concomitant abnormal  relaxation and increased filling pressure (grade 2 diastolic  dysfunction). Doppler parameters are consistent with elevated  ventricular end-diastolic filling pressure. - Aortic valve: Trileaflet; mildly thickened, mildly calcified  leaflets. There was no regurgitation. - Aortic root: The aortic root was normal in size. - Ascending aorta: The ascending aorta was normal in size. - Mitral valve: There was mild regurgitation. - Left atrium: The atrium was moderately dilated. - Right ventricle: Systolic function was normal. - Right atrium: The atrium was normal in size. -  Tricuspid valve: There was mild regurgitation. - Pulmonic valve: There was trivial regurgitation. - Pulmonary arteries: Systolic pressure was within the normal  range. - Inferior vena cava: The vessel was normal in size. - Pericardium, extracardiac: There was no pericardial effusion.    Radiology/Studies  Dg Chest Port 1 View  03/12/2016  CLINICAL DATA:  Shortness of breath. EXAM: PORTABLE CHEST 1 VIEW COMPARISON:  Radiograph of March 11, 2016. FINDINGS: Stable cardiomediastinal silhouette. Status post Coronary artery bypass graft. Increased bilateral perihilar and basilar interstitial densities are noted concerning for edema. No definite pneumothorax is noted. Minimal bilateral pleural effusions are noted. Bony thorax is unremarkable. IMPRESSION: Increased bilateral perihilar and basilar interstitial densities are noted concerning for edema with minimal pleural effusions. Electronically Signed   By: Lupita RaiderJames  Green Jr, M.D.   On: 03/12/2016 11:37    ASSESSMENT AND PLAN  Tommy Patterson is an 82 yo man with PMH of CAD s/p CABG '02, prior PCI of RCA 08/2010 with DES, s/p POBA of OM1 08/2014, T2DM, hypertension and dyslipidemia, intolerant of beta-blockers given bradycardia last seen by Dr. SwazilandJordan February 2017 who [facility] with [facility] and found to have NSTEMI. His son found him on the floor  1. NSTEMI  - amlodipine, imdur, and losartan held due to low BP. BP improved after holding meds. H/o intolerance to BB due to bradycardia  - unclear if related to fall, EKG no obvious ST elevation, no change compare to previous EKG RBBB  - trop 0.74 [facility], last trop 4.34 --> 3.25 --> 2.27. Total CK and CK MB ordered yesterday, but cancelled by lab for unknown reason. [facility] CPK 918 [facility].   - trop trending down. Echo 03/12/2016 EF 25-30%, grade 2 DD, mild MR/TR  - He denies any CP today or yesterday, given his mental status, I do not think he is a good candidate for  invasive intervention.    2. Fall: patient has dementia,   - CT head and neck no acute process  - discussed with Hospitalist Service yesterday, plan to take over care today.   3. Altered Mental status/confusion - delirium vs. Dementia. Ammonia level normal.  4. ? PNA at OSH (have not found hospital records)? PNA at OSH (have not found hospital records)  - currently on Vancomycin and zosyn, if blood cultures come back negative, will d/c abx  - I am unable to locate chest [facility] ray [facility].   5. PAF - newly diagnosed  - CHA2DS2-Vasc score 6 (HTN, DM, age, CAD, HF)  - spontaneously went into afib RVR in PM of 6/26, started on IV amiodarone, converted to NSR   6. CAD  - PCI/stent of the  RCA in December of 2011 with DES. He is s/p POBA of OM1 in December 2015   Medical Rx no plans for further cardiac interventions  Charlton Haws

## 2016-03-13 NOTE — Progress Notes (Signed)
Patient more alert today and able to have a conversation with anyone who is present. Fair appetite and he does require some assistance to eat. Vital signs are stable, no complaint of pain, no questions or concerns at this time. Family at bedside during the early part of the day. Son has discussed the need for his father to no longer drive. Rn has removed mittens for the better part of the afternoon as the patient is oriented and no longer pulling at lines. Call bell in reach, will continue to monitor.

## 2016-03-13 NOTE — Progress Notes (Signed)
ANTICOAGULATION CONSULT NOTE - Follow Up Consult  Pharmacy Consult for heparin  Indication: chest pain/ACS  Allergies  Allergen Reactions  . Glimepiride     unknown  . Prednisone     increases appetite    Patient Measurements: Height: 5\' 3"  (160 cm) Weight: 133 lb 6.1 oz (60.5 kg) IBW/kg (Calculated) : 56.9  Vital Signs: Temp: 97.6 F (36.4 C) (06/27 0746) Temp Source: Oral (06/27 0746) BP: 109/59 mmHg (06/27 0800) Pulse Rate: 73 (06/27 0800)  Labs:  Recent Labs  03/12/16 0223 03/12/16 0232 03/12/16 0804 03/12/16 1249 03/12/16 1511 03/13/16 0400  HGB 13.2  --   --   --   --  12.6*  HCT 38.5*  --   --   --   --  37.6*  PLT 202  --   --   --   --  187  LABPROT 18.2*  --   --   --   --   --   INR 1.50*  --   --   --   --   --   HEPARINUNFRC  --  0.63  --  0.38  --  0.33  CREATININE 0.96  --  0.97  --   --   --   TROPONINI 4.34*  --  3.25*  --  2.27*  --     Estimated Creatinine Clearance: 41.6 mL/min (by C-G formula based on Cr of 0.97).   Medications:  Infusions:  . amiodarone 30 mg/hr (03/13/16 0700)  . heparin 1,000 Units/hr (03/12/16 2111)    Assessment: 82 [facility]. Pharmacy dosing heparin for NSTEMI. Heparin is at goal (HL= 0.33on 1000 units/hr). No cath plans noted at this time.   Goal of Therapy:  Heparin level 0.3-0.7 units/ml Monitor platelets by anticoagulation protocol: Yes   Plan:  -No heparin changes needed -Daily heparin level and CBC  Harland GermanAndrew Marian Grandt, Pharm D 03/13/2016 8:32 AM

## 2016-03-14 ENCOUNTER — Inpatient Hospital Stay (HOSPITAL_COMMUNITY): Payer: Medicare Other

## 2016-03-14 DIAGNOSIS — I480 Paroxysmal atrial fibrillation: Secondary | ICD-10-CM

## 2016-03-14 LAB — COMPREHENSIVE METABOLIC PANEL
ALT: 35 U/L (ref 17–63)
AST: 42 U/L — AB (ref 15–41)
Albumin: 2.6 g/dL — ABNORMAL LOW (ref 3.5–5.0)
Alkaline Phosphatase: 56 U/L (ref 38–126)
Anion gap: 8 (ref 5–15)
BILIRUBIN TOTAL: 1 mg/dL (ref 0.3–1.2)
BUN: 41 mg/dL — AB (ref 6–20)
CHLORIDE: 105 mmol/L (ref 101–111)
CO2: 26 mmol/L (ref 22–32)
CREATININE: 0.78 mg/dL (ref 0.61–1.24)
Calcium: 8.8 mg/dL — ABNORMAL LOW (ref 8.9–10.3)
Glucose, Bld: 219 mg/dL — ABNORMAL HIGH (ref 65–99)
Potassium: 2.9 mmol/L — ABNORMAL LOW (ref 3.5–5.1)
Sodium: 139 mmol/L (ref 135–145)
TOTAL PROTEIN: 5.4 g/dL — AB (ref 6.5–8.1)

## 2016-03-14 LAB — GLUCOSE, CAPILLARY
GLUCOSE-CAPILLARY: 301 mg/dL — AB (ref 65–99)
GLUCOSE-CAPILLARY: 241 mg/dL — AB (ref 65–99)
GLUCOSE-CAPILLARY: 115 mg/dL — AB (ref 65–99)

## 2016-03-14 LAB — MAGNESIUM: Magnesium: 2 mg/dL (ref 1.7–2.4)

## 2016-03-14 LAB — CBC WITH DIFFERENTIAL/PLATELET
Basophils Absolute: 0 10*3/uL (ref 0.0–0.1)
Basophils Relative: 0 %
Eosinophils Absolute: 0 10*3/uL (ref 0.0–0.7)
Eosinophils Relative: 0 %
HCT: 38 % — ABNORMAL LOW (ref 39.0–52.0)
Hemoglobin: 12.8 g/dL — ABNORMAL LOW (ref 13.0–17.0)
Lymphocytes Relative: 8 %
Lymphs Abs: 0.9 10*3/uL (ref 0.7–4.0)
MCH: 30.3 pg (ref 26.0–34.0)
MCHC: 33.7 g/dL (ref 30.0–36.0)
MCV: 89.8 fL (ref 78.0–100.0)
Monocytes Absolute: 0.5 10*3/uL (ref 0.1–1.0)
Monocytes Relative: 4 %
Neutro Abs: 10.9 10*3/uL — ABNORMAL HIGH (ref 1.7–7.7)
Neutrophils Relative %: 88 %
Platelets: 209 10*3/uL (ref 150–400)
RBC: 4.23 MIL/uL (ref 4.22–5.81)
RDW: 13.7 % (ref 11.5–15.5)
WBC: 12.4 10*3/uL — ABNORMAL HIGH (ref 4.0–10.5)

## 2016-03-14 LAB — LACTIC ACID, PLASMA: LACTIC ACID, VENOUS: 1.6 mmol/L (ref 0.5–1.9)

## 2016-03-14 LAB — HEPARIN LEVEL (UNFRACTIONATED): HEPARIN UNFRACTIONATED: 0.28 [IU]/mL — AB (ref 0.30–0.70)

## 2016-03-14 MED ORDER — LORAZEPAM 2 MG/ML IJ SOLN
1.0000 mg | Freq: Once | INTRAMUSCULAR | Status: AC
  Administered 2016-03-14: 1 mg via INTRAVENOUS
  Filled 2016-03-14: qty 1

## 2016-03-14 MED ORDER — INSULIN ASPART 100 UNIT/ML ~~LOC~~ SOLN: 0.0000 [IU] | Freq: Every day | SUBCUTANEOUS | Status: DC

## 2016-03-14 MED ORDER — ASPIRIN 81 MG PO TABS: 81.0000 mg | ORAL_TABLET | Freq: Every day | ORAL | Status: DC

## 2016-03-14 MED ORDER — FUROSEMIDE 10 MG/ML IJ SOLN
40.0000 mg | Freq: Once | INTRAMUSCULAR | Status: AC
  Administered 2016-03-14: 40 mg via INTRAVENOUS
  Filled 2016-03-14: qty 4

## 2016-03-14 MED ORDER — INSULIN ASPART 100 UNIT/ML ~~LOC~~ SOLN
0.0000 [IU] | Freq: Three times a day (TID) | SUBCUTANEOUS | Status: DC
  Administered 2016-03-14: 7 [IU] via SUBCUTANEOUS
  Administered 2016-03-14: 3 [IU] via SUBCUTANEOUS
  Administered 2016-03-15 (×3): 2 [IU] via SUBCUTANEOUS
  Administered 2016-03-16: 3 [IU] via SUBCUTANEOUS

## 2016-03-14 MED ORDER — POTASSIUM CHLORIDE CRYS ER 20 MEQ PO TBCR
40.0000 meq | EXTENDED_RELEASE_TABLET | Freq: Once | ORAL | Status: AC
  Administered 2016-03-14: 40 meq via ORAL
  Filled 2016-03-14: qty 2

## 2016-03-14 MED ORDER — ENOXAPARIN SODIUM 40 MG/0.4ML ~~LOC~~ SOLN
40.0000 mg | SUBCUTANEOUS | Status: DC
  Administered 2016-03-14 – 2016-03-19 (×6): 40 mg via SUBCUTANEOUS
  Filled 2016-03-14 (×6): qty 0.4

## 2016-03-14 MED ORDER — POTASSIUM CHLORIDE CRYS ER 20 MEQ PO TBCR
30.0000 meq | EXTENDED_RELEASE_TABLET | ORAL | Status: AC
  Administered 2016-03-14 (×2): 30 meq via ORAL
  Filled 2016-03-14 (×2): qty 1

## 2016-03-14 NOTE — Evaluation (Signed)
Occupational Therapy Evaluation Patient Details Name: Tommy Patterson MRN: [id number] DOB: [birth year] Today's Date: 03/14/2016    History of Present Illness Pt is a 82 y/o M who [facility] with [facility] and found to have troponin of 0.7 consistent with NSTEMI leading to [facility].  His son reports pt has fallen a few times over the past week.  2 weeks ago pt passed dementia test but concern remains for early signs of dementia.  Pt's PMH includes MI, chronic low back pain, CABG x3, Bil carpal tunnel release.   Clinical Impression   This 82 yo male admitted and found to have above presents to acute OT with deficits below (se OT problem list) thus affecting what he reports as being independent to Mod I with basic ADLs. He will benefit from acute OT with follow up OT at SNF for potential to get back to PLOF.    Follow Up Recommendations  SNF;Supervision/Assistance - 24 hour    Equipment Recommendations  Other (comment) (TBD at next venue)       Precautions / Restrictions Precautions Precautions: Fall Restrictions Weight Bearing Restrictions: No      Mobility Bed Mobility               General bed mobility comments: Pt up in recliner upon arrival  Transfers Overall transfer level: Needs assistance Equipment used: 2 person hand held assist   Sit to Stand: Max assist (from recliner)              Balance Overall balance assessment: Needs assistance Sitting-balance support: Feet supported;Bilateral upper extremity supported Sitting balance-Leahy Scale: Poor Sitting balance - Comments: UE support while attempting to remain forward sitting in recliner   Standing balance support: Bilateral upper extremity supported Standing balance-Leahy Scale: Poor Standing balance comment: reliant on support of UEs in standing                            ADL Overall ADL's : Needs assistance/impaired Eating/Feeding: Set up (supported  sitting)   Grooming: Set up;Supervision/safety (supported sitting)   Upper Body Bathing: Supervision/ safety;Set up (supported sitting)   Lower Body Bathing: Maximal assistance (with max A sit<>stand from recliner)   Upper Body Dressing : Moderate assistance (supported sitting)   Lower Body Dressing: Maximal assistance (with Max A sit<>stand from recliner)                                 Pertinent Vitals/Pain Pain Assessment: No/denies pain     Hand Dominance Right   Extremity/Trunk Assessment Upper Extremity Assessment Upper Extremity Assessment: Generalized weakness;RUE deficits/detail;LUE deficits/detail RUE Deficits / Details: Decreased A/PROM at shoulder; rest WNL RUE Coordination: decreased gross motor LUE Deficits / Details: Decreased A/PROM at shoulder; rest WNL LUE Coordination: decreased gross motor           Communication Communication Communication: HOH   Cognition Arousal/Alertness: Awake/alert Behavior During Therapy: WFL for tasks assessed/performed Overall Cognitive Status:  ([facility]; but HOH may make it seem that he is not)                                Home Living Family/patient expects to be discharged to:: Skilled nursing facility Living Arrangements: Alone Available Help at Discharge: Family;Available PRN/intermittently Type of Home: House  Bathroom Shower/Tub: Runner, broadcasting/film/videoWalk-in shower         Home Equipment: Gilmer MorCane - single point;Walker - 2 wheels          Prior Functioning/Environment Level of Independence: Independent with assistive device(s)        Comments: Still driving, uses cane and RW to ambulate.  Is able to dress and shower independently but with difficulty.  Chart review indicated that pt has had several falls over the past week.    OT Diagnosis: Generalized weakness   OT Problem List: Decreased strength;Decreased range of motion;Decreased activity tolerance;Impaired balance  (sitting and/or standing);Decreased knowledge of use of DME or AE   OT Treatment/Interventions: Self-care/ADL training;Patient/family education;Balance training;Therapeutic activities;DME and/or AE instruction    OT Goals(Current goals can be found in he care plan section) Acute Rehab OT Goals Patient Stated Goal: to be able to go home OT Goal Formulation: With patient Time For Goal Achievement: 03/28/16 Potential to Achieve Goals: Good  OT Frequency: Min 2X/week   Barriers to D/C: Decreased caregiver support             End of Session Equipment Utilized During Treatment: Gait belt;Rolling walker  Activity Tolerance: Patient limited by fatigue Patient left: in chair;with call bell/phone within reach;with chair alarm set   Time: [id number] OT Time Calculation (min): 14 min Charges:  OT General Charges $OT Visit: 1 Procedure OT Evaluation $OT Eval Moderate Complexity: 1 Procedure  Tommy Patterson, Tommy Patterson [phone] 03/14/2016, 2:53 PM

## 2016-03-14 NOTE — Progress Notes (Signed)
Patient ID: Tommy Patterson, male   DOB: 05-27-1924, 82 y.o.   MRN: [id number]   Patient Name: Tommy HaberJohn Oliver Pospisil Date of Encounter: 03/14/2016  Primary Cardiologist: Dr. SwazilandJordan   Principal Problem:   NSTEMI (non-ST elevated myocardial infarction) [facility](HCC) Active Problems:   Coronary artery disease   Hypertension   Dyslipidemia   Diabetes mellitus type 2, uncontrolled (HCC)   Acute confusional state   Frequent falls   CAP (community acquired pneumonia)   Paroxysmal atrial fibrillation (HCC)   CAD in native artery    SUBJECTIVE  Denies any CP or SOB aids helping him with breakfast   CURRENT MEDS  Current facility-administered medications:  .  0.9 %  sodium chloride infusion, 250 mL, Intravenous, PRN, Leeann MustJacob Kelly, MD .  acetaminophen (TYLENOL) tablet 650 mg, 650 mg, Oral, Q4H PRN, Leeann MustJacob Kelly, MD .  [address][COMPLETED] amiodarone (NEXTERONE) 1.8 mg/mL load via infusion 150 mg, 150 mg, Intravenous, Once, 150 mg at 03/12/16 2130 **FOLLOWED BY** [EXPIRED] amiodarone (NEXTERONE PREMIX) 360-4.14 MG/200ML-% (1.8 mg/mL) IV infusion, 60 mg/hr, Intravenous, Continuous, Last Rate: 33.3 mL/hr at 03/12/16 2327, 60 mg/hr at 03/12/16 2327 **FOLLOWED BY** amiodarone (NEXTERONE PREMIX) 360-4.14 MG/200ML-% (1.8 mg/mL) IV infusion, 30 mg/hr, Intravenous, Continuous, Laurey Moralealton S McLean, MD, Last Rate: 16.7 mL/hr at 03/14/16 0242, 30 mg/hr at 03/14/16 0242 .  antiseptic oral rinse (CPC / CETYLPYRIDINIUM CHLORIDE 0.05%) solution 7 mL, 7 mL, Mouth Rinse, BID, Marinus MawGregg W Taylor, MD, 7 mL at 03/13/16 2200 .  aspirin EC tablet 81 mg, 81 mg, Oral, Daily, Leeann MustJacob Kelly, MD, 81 mg at 03/13/16 0948 .  atorvastatin (LIPITOR) tablet 80 mg, 80 mg, Oral, q1800, Leeann MustJacob Kelly, MD, 80 mg at 03/13/16 1703 .  cholecalciferol (VITAMIN D) tablet 1,000 Units, 1,000 Units, Oral, Daily, Leeann MustJacob Kelly, MD, 1,000 Units at 03/13/16 0948 .  heparin ADULT infusion 100 units/mL (25000 units/22350mL sodium chloride 0.45%), 1,150 Units/hr, Intravenous,  Continuous, Titus MouldCaron G Amend, RPH, Last Rate: 11.5 mL/hr at 03/14/16 0421, 1,150 Units/hr at 03/14/16 0421 .  hydroxypropyl methylcellulose / hypromellose (ISOPTO TEARS / GONIOVISC) 2.5 % ophthalmic solution 1 drop, 1 drop, Both Eyes, BID, Leeann MustJacob Kelly, MD, 1 drop at 03/13/16 2159 .  ipratropium (ATROVENT) 0.06 % nasal spray 1 spray, 1 spray, Each Nare, BID, Leeann MustJacob Kelly, MD, 1 spray at 03/13/16 2158 .  multivitamin with minerals tablet 1 tablet, 1 tablet, Oral, Daily, Leeann MustJacob Kelly, MD, 1 tablet at 03/13/16 0948 .  nitroGLYCERIN (NITROSTAT) SL tablet 0.4 mg, 0.4 mg, Sublingual, Q5 Min x 3 PRN, Leeann MustJacob Kelly, MD .  ondansetron [facility](ZOFRAN) injection 4 mg, 4 mg, Intravenous, Q6H PRN, Leeann MustJacob Kelly, MD .  piperacillin-tazobactam (ZOSYN) IVPB 3.375 g, 3.375 g, Intravenous, Q8H, Herby AbrahamMichelle T Bell, RPH, 3.375 g at 03/14/16 0242 .  potassium chloride (K-DUR,KLOR-CON) CR tablet 30 mEq, 30 mEq, Oral, Q4H, Leda GauzeKaren J Kirby-Graham, NP, 30 mEq at 03/14/16 0555 .  sodium chloride flush (NS) 0.9 % injection 3 mL, 3 mL, Intravenous, Q12H, Leeann MustJacob Kelly, MD, 3 mL at 03/13/16 0948 .  sodium chloride flush (NS) 0.9 % injection 3 mL, 3 mL, Intravenous, PRN, Leeann MustJacob Kelly, MD .  vancomycin (VANCOCIN) 500 mg in sodium chloride 0.9 % 100 mL IVPB, 500 mg, Intravenous, Q12H, Herby AbrahamMichelle T Bell, RPH, 500 mg at 03/14/16 0515 .  vitamin B-12 (CYANOCOBALAMIN) tablet 1,000 mcg, 1,000 mcg, Oral, Daily, Leeann MustJacob Kelly, MD, 1,000 mcg at 03/13/16 0948 OBJECTIVE  Filed Vitals:   03/14/16 0400 03/14/16 0746 03/14/16 0800 03/14/16 0834  BP: 111/56 125/53 109/54  Pulse: 78 79 75 79  Temp: 97.5 F (36.4 C) 97.7 F (36.5 C)    TempSrc: Oral Oral    Resp: Height:      Weight: 143 lb 4.8 oz (65 kg)     SpO2: 99% 98% 99% 97%    Intake/Output Summary (Last 24 hours) at 03/14/16 0909 Last data filed at 03/14/16 0100  Gross per 24 hour  Intake  595.1 ml  Output    100 ml  Net  495.1 ml   Filed Weights   03/12/16 0500 03/13/16 0336 03/14/16  0400  Weight: 133 lb 13.1 oz (60.7 kg) 133 lb 6.1 oz (60.5 kg) 143 lb 4.8 oz (65 kg)    PHYSICAL EXAM  General: Pleasant, NAD. Neuro: Alert and orientedx3 (president, year, location). Moderately demented, however able to answer most question if asked repeatedly. Moves all extremities spontaneously. Hands in [place] Psych: Normal affect. HEENT:  Normal  Neck: Supple without bruits or JVD. Lungs:  Resp regular and unlabored, anterior exam CTA. Heart: RRR no s3, s4, or murmurs. Abdomen: Soft, non-tender, non-distended, BS + x 4.  Foley  Extremities: No clubbing, cyanosis or edema. DP/PT/Radials 2+ and equal bilaterally.  Accessory Clinical Findings  CBC  Recent Labs  03/12/16 0223 03/13/16 0400 03/14/16 0320  WBC 16.3* 13.2* 12.4*  NEUTROABS 14.5*  --  10.9*  HGB 13.2 12.6* 12.8*  HCT 38.5* 37.6* 38.0*  MCV 91.7 90.4 89.8  PLT 202 187 209   Basic Metabolic Panel  Recent Labs  03/12/16 0223 03/12/16 0804 03/14/16 0320  NA 144 144 139  K 3.1* 3.1* 2.9*  CL 111 111 105  CO2 GLUCOSE 222* 180* 219*  BUN 61* 58* 41*  CREATININE 0.96 0.97 0.78  CALCIUM 9.1 9.0 8.8*  MG 1.9  --  2.0   Liver Function Tests  Recent Labs  03/12/16 0223 03/14/16 0320  AST 85* 42*  ALT 40 35  ALKPHOS 50 56  BILITOT 1.3* 1.0  PROT 6.0* 5.4*  ALBUMIN 3.2* 2.6*   Cardiac Enzymes  Recent Labs  03/12/16 0223 03/12/16 0804 03/12/16 1511  TROPONINI 4.34* 3.25* 2.27*   Fasting Lipid Panel  Recent Labs  03/12/16 0232  CHOL 165  HDL 42  LDLCALC 112*  TRIG 56  CHOLHDL 3.9   Thyroid Function Tests  Recent Labs  03/12/16 0232  TSH 0.685    TELE PAF / NSR     ECG  NSR with ST depression in inferior leads  Echocardiogram 03/12/2016  - Left ventricle: The cavity size was normal. Systolic function was  severely reduced. The estimated ejection fraction was in the  range of 25% to 30%. Features are consistent with a pseudonormal  left ventricular filling  pattern, with concomitant abnormal  relaxation and increased filling pressure (grade 2 diastolic  dysfunction). Doppler parameters are consistent with elevated  ventricular end-diastolic filling pressure. - Aortic valve: Trileaflet; mildly thickened, mildly calcified  leaflets. There was no regurgitation. - Aortic root: The aortic root was normal in size. - Ascending aorta: The ascending aorta was normal in size. - Mitral valve: There was mild regurgitation. - Left atrium: The atrium was moderately dilated. - Right ventricle: Systolic function was normal. - Right atrium: The atrium was normal in size. - Tricuspid valve: There was mild regurgitation. - Pulmonic valve: There was trivial regurgitation. - Pulmonary arteries: Systolic pressure was within the normal  range. - Inferior vena cava: The vessel was  normal in size. - Pericardium, extracardiac: There was no pericardial effusion.    Radiology/Studies  Dg Chest Port 1 View  03/14/2016  CLINICAL DATA:  Pneumonia EXAM: PORTABLE CHEST 1 VIEW COMPARISON:  03/12/2016 FINDINGS: Progression of diffuse bilateral airspace disease, most prominent in the bases. Progression of small pleural effusions and bibasilar atelectasis. Prior CABG IMPRESSION: Progression of diffuse bilateral airspace disease. This most likely is congestive heart failure with edema. Pneumonia not excluded Progression of bibasilar atelectasis and pleural effusions. Electronically Signed   By: Marlan Palauharles  Clark M.D.   On: 03/14/2016 07:27   Dg Chest Port 1 View  03/12/2016  CLINICAL DATA:  Shortness of breath. EXAM: PORTABLE CHEST 1 VIEW COMPARISON:  Radiograph of March 11, 2016. FINDINGS: Stable cardiomediastinal silhouette. Status post Coronary artery bypass graft. Increased bilateral perihilar and basilar interstitial densities are noted concerning for edema. No definite pneumothorax is noted. Minimal bilateral pleural effusions are noted. Bony thorax is unremarkable.  IMPRESSION: Increased bilateral perihilar and basilar interstitial densities are noted concerning for edema with minimal pleural effusions. Electronically Signed   By: Lupita RaiderJames  Green Jr, M.D.   On: 03/12/2016 11:37    ASSESSMENT AND PLAN  [name] is an 82 yo man with PMH of CAD s/p CABG '02, prior PCI of RCA 08/2010 with DES, s/p POBA of OM1 08/2014, T2DM, hypertension and dyslipidemia, intolerant of beta-blockers given bradycardia last seen by Dr. SwazilandJordan February 2017 who [facility] and found to have NSTEMI. His son found him on the floor  1. NSTEMI  - amlodipine, imdur, and losartan held due to low BP. BP improved after holding meds. H/o intolerance to BB due to bradycardia  - unclear if related to fall, EKG no obvious ST elevation, no change compare to previous EKG RBBB  - trop [facility].74 [facility], last trop 4.34 --> 3.25 --> 2.27. Total CK and CK MB ordered yesterday, but cancelled by lab for unknown reason. [facility] CPK 918 [facility].   - trop trending down. Echo 03/12/2016 EF 25-30%, grade 2 DD, mild MR/TR  - He denies any CP today or yesterday, no plans for further ischemic evaluation    2. Fall: patient has dementia,   - CT head and neck no acute process  - discussed with Hospitalist Service yesterday, plan to take over care today.   3. Altered Mental status/confusion - delirium vs. Dementia. Ammonia level normal.  4. ? PNA at OSH (have not found hospital records)? PNA at OSH (have not found hospital records)  - currently on Vancomycin and zosyn, if blood cultures come back negative, will d/c abx  - I am unable to locate chest x ray [facility].   5. PAF - newly diagnosed  - CHA2DS2-Vasc score 6 (HTN, DM, age, CAD, HF)  - will consider stopping amiodarone in am   6. CAD  - PCI/stent of the RCA in December of 2011 with DES. He is s/p POBA of OM1 in December 2015   Medical Rx no plans for further cardiac interventions  Charlton Hawseter  Challen Spainhour

## 2016-03-14 NOTE — Evaluation (Signed)
Physical Therapy Evaluation Patient Details Name: Tommy Patterson MRN: [id number] DOB: Jun 20, 1924 Today's Date: 03/14/2016   History of Present Illness  Pt is a 82 y/o M who [facility] with [facility] pain and found to have troponin of 0.7 consistent with NSTEMI leading to [facility].  His son reports pt has fallen a few times over the past week.  2 weeks ago pt passed dementia test but concern remains for early signs of dementia.  Pt's PMH includes MI, chronic low back pain, CABG x3, Bil carpal tunnel release.    Clinical Impression  Pt admitted with above diagnosis. Pt currently with functional limitations due to the deficits listed below (see PT Problem List). PTA Tommy Patterson and using his walker and cane to ambulate but reports he had difficulty w/ showering, dressing.  He currently requires mod assist for sit<>stand and stand pivot transfers.  Recommending SNF as pt was living alone PTA and no family present during evaluation to confirm any assist they may be able to provide.  Pt will benefit from skilled PT to increase their independence and safety with mobility to allow discharge to the venue listed below.      Follow Up Recommendations SNF;Supervision/Assistance - 24 hour    Equipment Recommendations  Other (comment) (TBD at next venue of care)    Recommendations for Other Services OT consult     Precautions / Restrictions Precautions Precautions: Fall Restrictions Weight Bearing Restrictions: No      Mobility  Bed Mobility Overal bed mobility: Needs Assistance Bed Mobility: Supine to Sit     Supine to sit: Mod assist     General bed mobility comments: Assist to elevate trunk and use of bed pad for positioning EOB  Transfers Overall transfer level: Needs assistance Equipment used: None Transfers: Sit to/from [employer] CorporationStand;Stand Pivot Transfers Sit to Stand: Mod assist Stand pivot transfers: Mod assist       General transfer  comment: Assist to boost to standing.  Pt demonstrates flexed posture w/ posterior bias requiring mod assist to steady w/ pivot to chair.  Ambulation/Gait             General Gait Details: Deferred for pt/therapist safety  Stairs            Wheelchair Mobility    Modified Rankin (Stroke Patients Only)       Balance Overall balance assessment: Needs assistance;History of Falls Sitting-balance support: Bilateral upper extremity supported;Feet supported Sitting balance-Leahy Scale: Fair Sitting balance - Comments: UEs supported on bed while sitting EOB Postural control: Posterior lean Standing balance support: During functional activity;No upper extremity supported Standing balance-Leahy Scale: Poor Standing balance comment: Relies on mod assist to steady w/ static stance and pivot to chair                             Pertinent Vitals/Pain Pain Assessment: Faces Faces Pain Scale: Hurts a little bit Pain Location: Rt chest/flank (pt denies pain at end of session sitting in chair) Pain Descriptors / Indicators: Discomfort Pain Intervention(s): Limited activity within patient's tolerance;Monitored during session;Repositioned    Home Living Family/patient expects to be discharged to:: Skilled nursing facility Living Arrangements: Alone Available Help at Discharge: Family;Available PRN/intermittently Type of Home: House         Home Equipment: Gilmer MorCane - single point;Walker - 2 wheels      Prior Function Level of Independence: Independent with  assistive device(s)         Comments: Still driving, uses cane and RW to ambulate.  Is able to dress and shower independently but with difficulty.  Chart review indicated that pt has had several falls over the past week.     Hand Dominance        Extremity/Trunk Assessment   Upper Extremity Assessment: Defer to OT evaluation           Lower Extremity Assessment: Generalized weakness      Cervical  / Trunk Assessment: Kyphotic  Communication   Communication: HOH  Cognition Arousal/Alertness: Awake/alert Behavior During Therapy: WFL for tasks assessed/performed Overall Cognitive Status: Difficult to assess                      General Comments General comments (skin integrity, edema, etc.): VSS.  Pt reports that he normally wears glasses and his daughter said she will bring them    Exercises        Assessment/Plan    PT Assessment Patient needs continued PT services  PT Diagnosis Difficulty walking;Generalized weakness   PT Problem List Decreased strength;Decreased range of motion;Decreased activity tolerance;Decreased balance;Decreased mobility;Decreased cognition;Decreased knowledge of use of DME;Decreased safety awareness;Cardiopulmonary status limiting activity;Pain  PT Treatment Interventions DME instruction;Stair training;Gait training;Functional mobility training;Therapeutic activities;Therapeutic exercise;Balance training;Cognitive remediation;Patient/family education   PT Goals (Current goals can be found in the Care Plan section) Acute Rehab PT Goals Patient Stated Goal: to get stronger and feel better PT Goal Formulation: With patient Time For Goal Achievement: 03/28/16 Potential to Achieve Goals: Good    Frequency Min 2X/week   Barriers to discharge Decreased caregiver support Lives alone    Co-evaluation               End of Session Equipment Utilized During Treatment: Gait belt Activity Tolerance: Patient tolerated treatment well;No increased pain;Patient limited by fatigue (no pain at end of session) Patient left: in chair;with call bell/phone within reach;with chair alarm set Nurse Communication: Mobility status;Other (comment) (pt needs to be cleaned up; catheter leaking)         Time: [id number] PT Time Calculation (min) (ACUTE ONLY): 19 min   Charges:   PT Evaluation $PT Eval Moderate Complexity: 1 Procedure     PT G Codes:        Encarnacion ChuAshley Abashian PT, DPT  Pager: [phone] Phone: [phone] 03/14/2016, 9:58 AM

## 2016-03-14 NOTE — Progress Notes (Signed)
ANTICOAGULATION CONSULT NOTE - Follow Up Consult  Pharmacy Consult for heparin  Indication: chest pain/ACS  Allergies  Allergen Reactions  . Glimepiride Other (See Comments)    unknown  . Prednisone Other (See Comments)    increases appetite    Patient Measurements: Height: 5\' 3"  (160 cm) Weight: 133 lb 6.1 oz (60.5 kg) IBW/kg (Calculated) : 56.9  Vital Signs: Temp: 97.5 F (36.4 C) (06/28 0023) Temp Source: Oral (06/28 0023) BP: 107/56 mmHg (06/28 0023) Pulse Rate: 75 (06/28 0023)  Labs:  Recent Labs  03/12/16 0223  03/12/16 0804 03/12/16 1249 03/12/16 1511 03/13/16 0400 03/14/16 0320 03/14/16 0325  HGB 13.2  --   --   --   --  12.6* 12.8*  --   HCT 38.5*  --   --   --   --  37.6* 38.0*  --   PLT 202  --   --   --   --  187 209  --   LABPROT 18.2*  --   --   --   --   --   --   --   INR 1.50*  --   --   --   --   --   --   --   HEPARINUNFRC  --   < >  --  0.38  --  0.33  --  0.28*  CREATININE 0.96  --  0.97  --   --   --   --   --   TROPONINI 4.34*  --  3.25*  --  2.27*  --   --   --   < > = values in this interval not displayed.  Estimated Creatinine Clearance: 41.6 mL/min (by C-G formula based on Cr of 0.97).   Medications:  Infusions:  . amiodarone 30 mg/hr (03/14/16 0242)  . heparin 1,000 Units/hr (03/13/16 1800)    Assessment: 82 yo M [facility]. Pharmacy dosing heparin for NSTEMI. Heparin level now down to slightly subtherapeutic on 1000 units/hr. No issues with line or bleeding reported per RN.  Goal of Therapy:  Heparin level 0.3-0.7 units/ml Monitor platelets by anticoagulation protocol: Yes   Plan:  -Increase heparin to 1150 units/hr -F/u 8 hr heparin level  Christoper Fabianaron Shaughnessy Gethers, PharmD, BCPS Clinical pharmacist, pager [phone]  03/14/2016 4:09 AM

## 2016-03-14 NOTE — Care Management Important Message (Signed)
Important Message  Patient Details  Name: Tommy Patterson MRN: [id number] Date of Birth: September 18, 1923   Medicare Important Message Given:  Yes    Zoeie Ritter, Stephan MinisterSusan Coleman 03/14/2016, 9:55 AM

## 2016-03-14 NOTE — Progress Notes (Signed)
OT Cancellation Note  Patient Details Name: [name] MRN: [id number] DOB: 10-02-1923   Cancelled Treatment:    Reason Eval/Treat Not Completed: Other (comment). Noted pt with K+ of 2.9 this AM and replacement K+ has not been initiated as of yet as well as pt has had heart arrhythmias this admission. Will await to see pt once K+ repletion has been initiated as schedule allows.  Evette GeorgesLeonard, [place] Joswick Eva [phone] 03/14/2016, 8:03 AM

## 2016-03-14 NOTE — Progress Notes (Signed)
PROGRESS NOTE   Ernest HaberJohn Oliver Helmer  OZH:[id number] DOB: [birth year] DOA: 03/12/2016 PCP: [name], [name], MD   Brief Narrative:  82 yo M Hx of CAD s/p CABG '02, prior PCI of RCA 08/2010 with DES, s/p POBA of OM1 08/2014, DM2, HTN, HLD, and intolerance to beta-blockers due to bradycardia who presented to [facility] with chest pain and found to have troponin of 0.7 leading to transfer to [facility]. On arrival, he was noted to be a bit confused. His son stated he found him on the floor and said he had fallen a few times in the last week to his knowledge. He reported that 2 weeks prior he passed a dementia test with "flying colors."   Assessment & Plan:   NSTEMI - CAD - EKG no obvious ST elevation, no change compare to previous EKG - trop trending down - TTE 03/12/2016 EF 25-30%, grade 2 DD, mild MR/TR - Not a good candidate for invasive intervention - PCI/stent of the RCA in December of 2011 with DES. He is s/p POBA of OM1 in December 2015 -Denies chest pain at this time - troponin trending downward  Hypokalemia - Replace and follow  DM2 - Poorly controlled - adjust medical therapy and follow  Falls - CT head and neck no acute process - appears to be high risk for anticoag given high likelihood of further falling  - PT/OT  Reported PNA at OSH - Unable to locate chest x ray image from [facility] - PCXR on 6/28 suggests pulmonary edema but patient does have an elevated white count and intermittent modest temperature elevations - plan to complete 5 days of antibiotic therapy but will narrow spectrum  PAF - newly diagnosed - went into afib RVR PM of 6/26 - started on IV amiodarone, converted to NSR; currently in NSR  - not good candidate for long-term anticoagulation - d/c heparin today  - Discontinue amiodarone in a.m. if remains stable - CHA2DS2-Vasc score 6  Goals of care -Patient A/O4 agrees that he needs to move in with one of his sons. States both sons have offered to have  him move in with them.  DVT prophylaxis: lovenox  Code Status: Full Family Communication:  None Disposition Plan: SDU - ultimate plan is for discharge home with family pending PT/OT evaluations  Consultants:  Cardiology  Procedures/Significant Events:  None  Antimicrobials: Zosyn 6/26 >  Vancomycin 6/26 > 6/28  Subjective: The patient is sitting up in a bedside chair.  He denies chest pain or shortness of breath at this time.  [place] can tell [place] and he can tell me why he is here.  He states he is hungry.  He reports that he is anxious to be discharged home to his family.   Objective: Filed Vitals:   03/14/16 0400 03/14/16 0746 03/14/16 0800 03/14/16 0834  BP: 111/56 125/53 109/54   Pulse: 78 79 75 79  Temp: 97.5 F (36.4 C) 97.7 F (36.5 C)    TempSrc: Oral Oral    Resp: 17 23 18 22   Height:      Weight: 65 kg (143 lb 4.8 oz)     SpO2: 99% 98% 99% 97%    Intake/Output Summary (Last 24 hours) at 03/14/16 1054 Last data filed at 03/14/16 0100  Gross per 24 hour  Intake  464.9 ml  Output      0 ml  Net  464.9 ml   Filed Weights   03/12/16 0500 03/13/16 0336  03/14/16 0400  Weight: 60.7 kg (133 lb 13.1 oz) 60.5 kg (133 lb 6.1 oz) 65 kg (143 lb 4.8 oz)    Examination: General: No acute respiratory distress Lungs: Clear to auscultation bilaterally w/ faint bibasilar crackles and no wheeze  Cardiovascular: Regular rate and rhythm without murmur gallop or rub Abdomen: Nontender, nondistended, soft, bowel sounds positive, no rebound, no ascites, no appreciable mass Extremities: No significant cyanosis, clubbing, or edema bilateral lower extremities   CBC:  Recent Labs Lab 03/12/16 0223 03/13/16 0400 03/14/16 0320  WBC 16.3* 13.2* 12.4*  NEUTROABS 14.5*  --  10.9*  HGB 13.2 12.6* 12.8*  HCT 38.5* 37.6* 38.0*  MCV 91.7 90.4 89.8  PLT 202 187 209   Basic Metabolic Panel:  Recent Labs Lab 03/12/16 0223 03/12/16 0804 03/14/16 0320  NA 144  144 139  K 3.1* 3.1* 2.9*  CL 111 111 105  CO2 GLUCOSE 222* 180* 219*  BUN 61* 58* 41*  CREATININE 0.96 0.97 0.78  CALCIUM 9.1 9.0 8.8*  MG 1.9  --  2.0   GFR: Estimated Creatinine Clearance: 50.4 mL/min (by C-G formula based on Cr of 0.78).   Liver Function Tests:  Recent Labs Lab 03/12/16 0223 03/14/16 0320  AST 85* 42*  ALT 40 35  ALKPHOS 50 56  BILITOT 1.3* 1.0  PROT 6.0* 5.4*  ALBUMIN 3.2* 2.6*    Recent Labs Lab 03/12/16 1250  AMMONIA 30   Coagulation Profile:  Recent Labs Lab 03/12/16 0223  INR 1.50*   Cardiac Enzymes:  Recent Labs Lab 03/12/16 0223 03/12/16 0804 03/12/16 1511  TROPONINI 4.34* 3.25* 2.27*   HbA1C:  Recent Labs  03/12/16 0232  HGBA1C 6.9*   CBG: No results for input(s): GLUCAP in the last 168 hours.   Lipid Profile:  Recent Labs  03/12/16 0232  CHOL 165  HDL 42  LDLCALC 112*  TRIG 56  CHOLHDL 3.9   Thyroid Function Tests:  Recent Labs  03/12/16 0232  TSH 0.685   Urine analysis:    Component Value Date/Time   COLORURINE AMBER* 03/12/2016 0248   APPEARANCEUR CLOUDY* 03/12/2016 0248   LABSPEC 1.032* 03/12/2016 0248   PHURINE 6.0 03/12/2016 0248   GLUCOSEU 100* 03/12/2016 0248   HGBUR LARGE* 03/12/2016 0248   BILIRUBINUR SMALL* 03/12/2016 0248   KETONESUR 15* 03/12/2016 0248   PROTEINUR 100* 03/12/2016 0248   NITRITE NEGATIVE 03/12/2016 0248   LEUKOCYTESUR NEGATIVE 03/12/2016 0248    Recent Results (from the past 240 hour(s))  MRSA PCR Screening     Status: None   Collection Time: 03/12/16 12:49 AM  Result Value Ref Range Status   MRSA by PCR NEGATIVE NEGATIVE Final    Comment:        The GeneXpert MRSA Assay (FDA approved for NASAL specimens only), is one component of a comprehensive MRSA colonization surveillance program. It is not intended to diagnose MRSA infection nor to guide or monitor treatment for MRSA infections.   Culture, blood (routine x 2)     Status: None  (Preliminary result)   Collection Time: 03/12/16  2:32 AM  Result Value Ref Range Status   Specimen Description BLOOD LEFT ANTECUBITAL  Final   Special Requests BOTTLES DRAWN AEROBIC AND ANAEROBIC 10CC   Final   Culture NO GROWTH 1 DAY  Final   Report Status PENDING  Incomplete  Culture, blood (routine x 2)     Status: None (Preliminary result)   Collection Time: 03/12/16  2:38  AM  Result Value Ref Range Status   Specimen Description BLOOD RIGHT HAND  Final   Special Requests IN PEDIATRIC BOTTLE 1CC  Final   Culture NO GROWTH 1 DAY  Final   Report Status PENDING  Incomplete    Radiology Studies: Dg Chest Port 1 View  03/14/2016  CLINICAL DATA:  Pneumonia EXAM: PORTABLE CHEST 1 VIEW COMPARISON:  03/12/2016 FINDINGS: Progression of diffuse bilateral airspace disease, most prominent in the bases. Progression of small pleural effusions and bibasilar atelectasis. Prior CABG IMPRESSION: Progression of diffuse bilateral airspace disease. This most likely is congestive heart failure with edema. Pneumonia not excluded Progression of bibasilar atelectasis and pleural effusions. Electronically Signed   By: Marlan Palauharles  Clark M.D.   On: 03/14/2016 07:27   Scheduled Meds: . antiseptic oral rinse  7 mL Mouth Rinse BID  . aspirin EC  81 mg Oral Daily  . atorvastatin  80 mg Oral q1800  . cholecalciferol  1,000 Units Oral Daily  . hydroxypropyl methylcellulose / hypromellose  1 drop Both Eyes BID  . ipratropium  1 spray Each Nare BID  . multivitamin with minerals  1 tablet Oral Daily  . piperacillin-tazobactam (ZOSYN)  IV  3.375 g Intravenous Q8H  . sodium chloride flush  3 mL Intravenous Q12H  . vancomycin  500 mg Intravenous Q12H  . vitamin B-12  1,000 mcg Oral Daily   Continuous Infusions: . amiodarone 30 mg/hr (03/14/16 0242)  . heparin 1,150 Units/hr (03/14/16 0421)     LOS: 2 days    Time spent: 35 minutes  Lonia BloodJeffrey T. Sirenity Shew, MD [facility] Office  [phone] Pager - Text Page  per Amion as per below:  On-Call/Text Page:      [email or web address]      password [email or web address]  If 7PM-7AM, please contact night-coverage [email or web address] Password [email or web address] 03/14/2016, 11:32 AM

## 2016-03-15 ENCOUNTER — Inpatient Hospital Stay (HOSPITAL_COMMUNITY): Payer: Medicare Other

## 2016-03-15 DIAGNOSIS — J189 Pneumonia, unspecified organism: Secondary | ICD-10-CM | POA: Diagnosis present

## 2016-03-15 DIAGNOSIS — R630 Anorexia: Secondary | ICD-10-CM | POA: Diagnosis present

## 2016-03-15 LAB — CBC
HEMATOCRIT: 39.8 % (ref 39.0–52.0)
HEMOGLOBIN: 13.5 g/dL (ref 13.0–17.0)
MCH: 31 pg (ref 26.0–34.0)
MCHC: 33.9 g/dL (ref 30.0–36.0)
MCV: 91.3 fL (ref 78.0–100.0)
Platelets: 185 10*3/uL (ref 150–400)
RBC: 4.36 MIL/uL (ref 4.22–5.81)
RDW: 13.7 % (ref 11.5–15.5)
WBC: 11.3 10*3/uL — AB (ref 4.0–10.5)

## 2016-03-15 LAB — GLUCOSE, CAPILLARY
GLUCOSE-CAPILLARY: 170 mg/dL — AB (ref 65–99)
GLUCOSE-CAPILLARY: 194 mg/dL — AB (ref 65–99)
GLUCOSE-CAPILLARY: 193 mg/dL — AB (ref 65–99)
Glucose-Capillary: 171 mg/dL — ABNORMAL HIGH (ref 65–99)

## 2016-03-15 LAB — URINE MICROSCOPIC-ADD ON

## 2016-03-15 LAB — URINALYSIS, ROUTINE W REFLEX MICROSCOPIC
Bilirubin Urine: NEGATIVE
Glucose, UA: NEGATIVE mg/dL
KETONES UR: 15 mg/dL — AB
LEUKOCYTES UA: NEGATIVE
NITRITE: NEGATIVE
PH: 5.5 (ref 5.0–8.0)
Protein, ur: NEGATIVE mg/dL
SPECIFIC GRAVITY, URINE: 1.026 (ref 1.005–1.030)

## 2016-03-15 LAB — COMPREHENSIVE METABOLIC PANEL
ALT: 36 U/L (ref 17–63)
ANION GAP: 8 (ref 5–15)
AST: 41 U/L (ref 15–41)
Albumin: 2.6 g/dL — ABNORMAL LOW (ref 3.5–5.0)
Alkaline Phosphatase: 43 U/L (ref 38–126)
BILIRUBIN TOTAL: 1.1 mg/dL (ref 0.3–1.2)
BUN: 28 mg/dL — ABNORMAL HIGH (ref 6–20)
CO2: 25 mmol/L (ref 22–32)
Calcium: 8.8 mg/dL — ABNORMAL LOW (ref 8.9–10.3)
Chloride: 103 mmol/L (ref 101–111)
Creatinine, Ser: 0.63 mg/dL (ref 0.61–1.24)
GFR calc Af Amer: >60 mL/min (ref >60)
Glucose, Bld: 177 mg/dL — ABNORMAL HIGH (ref 65–99)
POTASSIUM: 3.9 mmol/L (ref 3.5–5.1)
Sodium: 136 mmol/L (ref 135–145)
TOTAL PROTEIN: 4.9 g/dL — AB (ref 6.5–8.1)

## 2016-03-15 MED ORDER — COLLAGENASE 250 UNIT/GM EX OINT
TOPICAL_OINTMENT | Freq: Every day | CUTANEOUS | Status: DC
  Administered 2016-03-15 – 2016-03-19 (×5): via TOPICAL
  Filled 2016-03-15 (×4): qty 30

## 2016-03-15 MED ORDER — MEGESTROL ACETATE 400 MG/10ML PO SUSP
800.0000 mg | Freq: Every day | ORAL | Status: DC
  Administered 2016-03-15 – 2016-03-18 (×4): 800 mg via ORAL
  Filled 2016-03-15 (×5): qty 20

## 2016-03-15 MED ORDER — VANCOMYCIN HCL 500 MG IV SOLR
500.0000 mg | Freq: Two times a day (BID) | INTRAVENOUS | Status: DC
  Administered 2016-03-15 – 2016-03-18 (×7): 500 mg via INTRAVENOUS
  Filled 2016-03-15 (×11): qty 500

## 2016-03-15 MED ORDER — FUROSEMIDE 10 MG/ML IJ SOLN
40.0000 mg | Freq: Two times a day (BID) | INTRAMUSCULAR | Status: DC
  Administered 2016-03-15 – 2016-03-19 (×8): 40 mg via INTRAVENOUS
  Filled 2016-03-15 (×8): qty 4

## 2016-03-15 NOTE — Consult Note (Signed)
WOC wound consult note Reason for Consult:  Right elbow Wound type: trauma after fall Measurement: 10cm x 2cm x 0.2cm  Wound bed: 90% yellow with 10%pink at wound edges Drainage (amount, consistency, odor) minimal, serosanguinous, no odor  Periwound: intact  Dressing procedure/placement/frequency: Add enzymatic debridement ointment to clean up the wound, cover with moist gauze. Change daily.  Discussed POC with patient and bedside nurse.  Re consult if needed, will not follow at this time. Thanks  Sanad Fearnow M.[employer] MSN, RN,CNS, CWOCN [phone])
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
## 2016-03-15 NOTE — Progress Notes (Signed)
Spoke w dr Joseph [place]woods. Pt maybe ready for dc fri/sat. phy there and occup there rec snf. Spoke w son Gery Praybarry [phone] and his wife is nse at [employer] in [place]. They are in agreement to snf and prefer [employer]. Have alerted sw of need for snf.

## 2016-03-15 NOTE — Progress Notes (Signed)
PROGRESS NOTE    Tommy Patterson  MWU:[id number] DOB: [birth year] DOA: 03/12/2016 PCP: Donzetta Sprung, MD   Brief Narrative:  82 yo WM PMHx of CAD s/p CABG '02, prior PCI of RCA 08/2010 with DES, s/p POBA of OM1 08/2014, T2DM, hypertension and dyslipidemia, intolerant of beta-blockers given bradycardia last seen by Dr. [place] February 2017 who presented to [facility] with chest pain and found to have troponin of 0.7 consistent with NSTEMI leading to transfer to [facility]. On arrival, he was noted to be hard of hearing. He knows who he is but he is a bit confused - knows hospital. His son came in and said he found him on the floor earlier tonight and said he had fallen a few times in the last week to his knowledge. He says that 2 weeks ago he passed a dementia test with "flying colors" however he is not acting like himself now. His son says he has not complained of any chest pain.    Assessment & Plan:   Principal Problem:   NSTEMI (non-ST elevated myocardial infarction) (HCC) Active Problems:   Coronary artery disease   Hypertension   Dyslipidemia   Diabetes mellitus type 2, uncontrolled (HCC)   Acute confusional state   Frequent falls   CAP (community acquired pneumonia)   Paroxysmal atrial fibrillation (HCC)   CAD in native artery   HCAP (healthcare-associated pneumonia)   Anorexia    NSTEMI - amlodipine, imdur, and losartan held due to low BP. BP improved after holding meds. H/o intolerance to BB due to bradycardia - unclear if related to fall, EKG no obvious ST elevation, no change compare to previous EKG RBBB - trop 0.74 --> 0.82 at [facility], last trop 4.34 --> 3.25 --> 2.27. Total CK and CK MB ordered yesterday, but cancelled by lab for unknown reason. Total CPK 918 at [facility].  - trop trending down. Echo 03/12/2016 EF 25-30%, grade 2 DD, mild MR/TR - Not a good candidate for invasive intervention.  -PT/OT; recommend SNF; will contact Gery Pray (son) in [place] wife RN  for SNF in [place]  Acute systolic and diastolic CHF -Strict in and out since admission +2.2 L -Daily weight Filed Weights   03/13/16 0336 03/14/16 0400 03/15/16 0400  Weight: 60.5 kg (133 lb 6.1 oz) 65 kg (143 lb 4.8 oz) 63.6 kg (140 lb 3.4 oz)  -Patient's PCXR showing good overload: Start gentle diuresis  -Lasix 40 mg BID -If patient's BP tolerates will add small amount beta blocker  PAF - newly diagnosed( CHA2DS2-Vasc score 6) - Amiodarone drip - spontaneously went into afib RVR in PM of 6/26, started on IV amiodarone, converted to NSR; currently in NSR  -Not good candidate for long-term anticoagulation. Would DC heparin drip in [place]  CAD native artery - PCI/stent of the RCA in December of 2011 with DES. He is s/p POBA of OM1 in December 2015  Fall:  -patient has dementia/AMS,  - CT head and neck no acute process - AMS waxes and wanes. Today A/O 1   PNA at OSH(HCAP) - (have not found hospital records)? PNA at OSH (have not found hospital records) -Given patient's frail condition would complete 5 day course of antibiotic -Unable to locate chest x ray image from [place].   Anorexia -Megace 800 mg daily   Goals of care -Patient A/O 4 agrees that he needs to move in with one of his sons. States both sons have offered to have him move in with them. Consult  social work to arrange family meeting to discuss discharge arrangement -6/29 contact Jillyn HiddenGary [place] to discuss plan of care.     DVT prophylaxis: Heparin drip Code Status: Full Family Communication: None Disposition Plan: Patient has agreed to stay with one of his sons. CSW to arrange family meeting to discuss discharge arrangements   Consultants:  Cardiology  Procedures/Significant Events:  6/26 echocardiogram;Left ventricle:-LVEF= 25% to 30%. -(grade 2 diastolic dysfunction). - Left atrium: moderately dilated. 6/29 PCXR; worsening pulmonary edema, and right pleural effusion    Cultures 6/26 blood left  AC/right hand NGTD  Antimicrobials: Zosyn 6/26>> Vancomycin 6/26>>   Devices    LINES / TUBES:      Continuous Infusions: . amiodarone 30 mg/hr (03/15/16 1622)     Subjective: 6/29  A/O 1 (does not know where, when, why), does know that he is not at home.     Objective: Filed Vitals:   03/15/16 1400 03/15/16 1600 03/15/16 1701 03/15/16 1800  BP: 119/75 107/60 107/60 129/64  Pulse: 60 66 67 70  Temp:   97.9 F (36.6 C)   TempSrc:   Axillary   Resp: 13 17 23 21   Height:      Weight:      SpO2: 91% 97% 96% 93%    Intake/Output Summary (Last 24 hours) at 03/15/16 1945 Last data filed at 03/15/16 1800  Gross per 24 hour  Intake 1374.7 ml  Output    700 ml  Net  674.7 ml   Filed Weights   03/13/16 0336 03/14/16 0400 03/15/16 0400  Weight: 60.5 kg (133 lb 6.1 oz) 65 kg (143 lb 4.8 oz) 63.6 kg (140 lb 3.4 oz)    Examination:  General: A/O 1 (does not know where, when, why), NAD, cachectic, No acute respiratory distress Eyes: negative scleral hemorrhage, negative anisocoria, negative icterus ENT: Negative Runny nose, negative gingival bleeding, Neck:  Negative scars, masses, torticollis, lymphadenopathy, JVD Lungs: Clear to auscultation bilaterally without wheezes or crackles Cardiovascular: Regular rate and rhythm without murmur gallop or rub normal S1 and S2 Abdomen: negative abdominal pain, nondistended, positive soft, bowel sounds, no rebound, no ascites, no appreciable mass Extremities: No significant cyanosis, clubbing, or edema bilateral lower extremities Skin: Negative rashes, lesions, ulcers Psychiatric:  Negative depression, negative anxiety, negative fatigue, negative mania  Central nervous system:  Cranial nerves II through XII intact, tongue/uvula midline, all extremities muscle strength 5/5, sensation intact throughout, negative dysarthria, negative expressive aphasia, negative receptive aphasia.  .     Data Reviewed: Care during the  described time interval was provided by me .  I have reviewed this patient's available data, including medical history, events of note, physical examination, and all test results as part of my evaluation. I have personally reviewed and interpreted all radiology studies.  CBC:  Recent Labs Lab 03/12/16 0223 03/13/16 0400 03/14/16 0320 03/15/16 0214  WBC 16.3* 13.2* 12.4* 11.3*  NEUTROABS 14.5*  --  10.9*  --   HGB 13.2 12.6* 12.8* 13.5  HCT 38.5* 37.6* 38.0* 39.8  MCV 91.7 90.4 89.8 91.3  PLT 202 187 209 185   Basic Metabolic Panel:  Recent Labs Lab 03/12/16 0223 03/12/16 0804 03/14/16 0320 03/15/16 0214  NA 144 144 139 136  K 3.1* 3.1* 2.9* 3.9  CL 111 111 105 103  CO2 25 23 26 25   GLUCOSE 222* 180* 219* 177*  BUN 61* 58* 41* 28*  CREATININE 0.96 0.97 0.78 0.63  CALCIUM 9.1 9.0 8.8* 8.8*  MG 1.9  --  2.0  --    GFR: Estimated Creatinine Clearance: 50.4 mL/min (by C-G formula based on Cr of 0.63). Liver Function Tests:  Recent Labs Lab 03/12/16 0223 03/14/16 0320 03/15/16 0214  AST 85* 42* 41  ALT 40 35 36  ALKPHOS 50 56 43  BILITOT 1.3* 1.0 1.1  PROT 6.0* 5.4* 4.9*  ALBUMIN 3.2* 2.6* 2.6*   No results for input(s): LIPASE, AMYLASE in the last 168 hours.  Recent Labs Lab 03/12/16 1250  AMMONIA 30   Coagulation Profile:  Recent Labs Lab 03/12/16 0223  INR 1.50*   Cardiac Enzymes:  Recent Labs Lab 03/12/16 0223 03/12/16 0804 03/12/16 1511  TROPONINI 4.34* 3.25* 2.27*   BNP (last 3 results) No results for input(s): PROBNP in the last 8760 hours. HbA1C: No results for input(s): HGBA1C in the last 72 hours. CBG:  Recent Labs Lab 03/14/16 1632 03/14/16 1957 03/15/16 0806 03/15/16 1216 03/15/16 1707  GLUCAP 241* 115* 170* 194* 193*   Lipid Profile: No results for input(s): CHOL, HDL, LDLCALC, TRIG, CHOLHDL, LDLDIRECT in the last 72 hours. Thyroid Function Tests: No results for input(s): TSH, T4TOTAL, FREET4, T3FREE, THYROIDAB in the  last 72 hours. Anemia Panel: No results for input(s): VITAMINB12, FOLATE, FERRITIN, TIBC, IRON, RETICCTPCT in the last 72 hours. Urine analysis:    Component Value Date/Time   COLORURINE YELLOW 03/15/2016 0545   APPEARANCEUR CLOUDY* 03/15/2016 0545   LABSPEC 1.026 03/15/2016 0545   PHURINE 5.5 03/15/2016 0545   GLUCOSEU NEGATIVE 03/15/2016 0545   HGBUR LARGE* 03/15/2016 0545   BILIRUBINUR NEGATIVE 03/15/2016 0545   KETONESUR 15* 03/15/2016 0545   PROTEINUR NEGATIVE 03/15/2016 0545   NITRITE NEGATIVE 03/15/2016 0545   LEUKOCYTESUR NEGATIVE 03/15/2016 0545   Sepsis Labs: (procalcitonin:4,lacticidven:4)  ) Recent Results (from the past 240 hour(s))  MRSA PCR Screening     Status: None   Collection Time: 03/12/16 12:49 AM  Result Value Ref Range Status   MRSA by PCR NEGATIVE NEGATIVE Final    Comment:        The GeneXpert MRSA Assay (FDA approved for NASAL specimens only), is one component of a comprehensive MRSA colonization surveillance program. It is not intended to diagnose MRSA infection nor to guide or monitor treatment for MRSA infections.   Culture, blood (routine x 2)     Status: None (Preliminary result)   Collection Time: 03/12/16  2:32 AM  Result Value Ref Range Status   Specimen Description BLOOD LEFT ANTECUBITAL  Final   Special Requests BOTTLES DRAWN AEROBIC AND ANAEROBIC 10CC   Final   Culture NO GROWTH 3 DAYS  Final   Report Status PENDING  Incomplete  Culture, blood (routine x 2)     Status: None (Preliminary result)   Collection Time: 03/12/16  2:38 AM  Result Value Ref Range Status   Specimen Description BLOOD RIGHT HAND  Final   Special Requests IN PEDIATRIC BOTTLE 1CC  Final   Culture NO GROWTH 3 DAYS  Final   Report Status PENDING  Incomplete         Radiology Studies: Dg Chest Port 1 View  03/15/2016  CLINICAL DATA:  Pulmonary edema EXAM: PORTABLE CHEST 1 VIEW COMPARISON:  03/14/2016 FINDINGS: Progression of diffuse bilateral  airspace disease. Progression of bibasilar airspace disease due to atelectasis and effusion. Prior CABG. IMPRESSION: Worsening bilateral airspace disease consistent with pulmonary edema. Progression of bibasilar atelectasis and bilateral pleural effusions. Electronically Signed   By: Marlan Palau M.D.   On: 03/15/2016 07:27  Dg Chest Port 1 View  03/14/2016  CLINICAL DATA:  Pneumonia EXAM: PORTABLE CHEST 1 VIEW COMPARISON:  03/12/2016 FINDINGS: Progression of diffuse bilateral airspace disease, most prominent in the bases. Progression of small pleural effusions and bibasilar atelectasis. Prior CABG IMPRESSION: Progression of diffuse bilateral airspace disease. This most likely is congestive heart failure with edema. Pneumonia not excluded Progression of bibasilar atelectasis and pleural effusions. Electronically Signed   By: Marlan Palauharles  Clark M.D.   On: 03/14/2016 07:27        Scheduled Meds: . antiseptic oral rinse  7 mL Mouth Rinse BID  . aspirin EC  81 mg Oral Daily  . atorvastatin  80 mg Oral q1800  . cholecalciferol  1,000 Units Oral Daily  . collagenase   Topical Daily  . enoxaparin (LOVENOX) injection  40 mg Subcutaneous Q24H  . furosemide  40 mg Intravenous BID  . hydroxypropyl methylcellulose / hypromellose  1 drop Both Eyes BID  . insulin aspart  0-5 Units Subcutaneous QHS  . insulin aspart  0-9 Units Subcutaneous TID WC  . ipratropium  1 spray Each Nare BID  . megestrol  800 mg Oral Daily  . multivitamin with minerals  1 tablet Oral Daily  . piperacillin-tazobactam (ZOSYN)  IV  3.375 g Intravenous Q8H  . sodium chloride flush  3 mL Intravenous Q12H  . vancomycin  500 mg Intravenous Q12H  . vitamin B-12  1,000 mcg Oral Daily   Continuous Infusions: . amiodarone 30 mg/hr (03/15/16 1622)     LOS: 3 days    Time spent: 40 minutes    WOODS, Roselind MessierURTIS J, MD [facility] Pager [phone]   If 7PM-7AM, please contact night-coverage [email or web address] Password  [facility] EdRH1 03/15/2016, 7:45 PM

## 2016-03-16 DIAGNOSIS — E118 Type 2 diabetes mellitus with unspecified complications: Secondary | ICD-10-CM | POA: Diagnosis present

## 2016-03-16 DIAGNOSIS — I5041 Acute combined systolic (congestive) and diastolic (congestive) heart failure: Secondary | ICD-10-CM | POA: Diagnosis present

## 2016-03-16 LAB — GLUCOSE, CAPILLARY
GLUCOSE-CAPILLARY: 219 mg/dL — AB (ref 65–99)
GLUCOSE-CAPILLARY: 196 mg/dL — AB (ref 65–99)
GLUCOSE-CAPILLARY: 150 mg/dL — AB (ref 65–99)
Glucose-Capillary: 217 mg/dL — ABNORMAL HIGH (ref 65–99)

## 2016-03-16 LAB — CBC WITH DIFFERENTIAL/PLATELET
BASOS PCT: 0 %
Basophils Absolute: 0 10*3/uL (ref 0.0–0.1)
EOS ABS: 0.1 10*3/uL (ref 0.0–0.7)
Eosinophils Relative: 1 %
HEMATOCRIT: 39.8 % (ref 39.0–52.0)
HEMOGLOBIN: 13.6 g/dL (ref 13.0–17.0)
Lymphocytes Relative: 5 %
Lymphs Abs: 0.6 10*3/uL — ABNORMAL LOW (ref 0.7–4.0)
MCH: 31 pg (ref 26.0–34.0)
MCHC: 34.2 g/dL (ref 30.0–36.0)
MCV: 90.7 fL (ref 78.0–100.0)
MONOS PCT: 9 %
Monocytes Absolute: 1.1 10*3/uL — ABNORMAL HIGH (ref 0.1–1.0)
NEUTROS PCT: 85 %
Neutro Abs: 11 10*3/uL — ABNORMAL HIGH (ref 1.7–7.7)
Platelets: 214 10*3/uL (ref 150–400)
RBC: 4.39 MIL/uL (ref 4.22–5.81)
RDW: 13.4 % (ref 11.5–15.5)
WBC: 12.8 10*3/uL — ABNORMAL HIGH (ref 4.0–10.5)

## 2016-03-16 LAB — BASIC METABOLIC PANEL
ANION GAP: 9 (ref 5–15)
BUN: 15 mg/dL (ref 6–20)
CALCIUM: 9.2 mg/dL (ref 8.9–10.3)
CO2: 32 mmol/L (ref 22–32)
Chloride: 93 mmol/L — ABNORMAL LOW (ref 101–111)
Creatinine, Ser: 0.87 mg/dL (ref 0.61–1.24)
GLUCOSE: 176 mg/dL — AB (ref 65–99)
Potassium: 3.4 mmol/L — ABNORMAL LOW (ref 3.5–5.1)
SODIUM: 134 mmol/L — AB (ref 135–145)

## 2016-03-16 LAB — MAGNESIUM: MAGNESIUM: 1.7 mg/dL (ref 1.7–2.4)

## 2016-03-16 MED ORDER — INSULIN ASPART 100 UNIT/ML ~~LOC~~ SOLN
0.0000 [IU] | SUBCUTANEOUS | Status: DC
  Administered 2016-03-16: 3 [IU] via SUBCUTANEOUS
  Administered 2016-03-16 (×2): 2 [IU] via SUBCUTANEOUS
  Administered 2016-03-16: 5 [IU] via SUBCUTANEOUS
  Administered 2016-03-17: 1 [IU] via SUBCUTANEOUS
  Administered 2016-03-17: 2 [IU] via SUBCUTANEOUS
  Administered 2016-03-17 (×2): 3 [IU] via SUBCUTANEOUS
  Administered 2016-03-17: 1 [IU] via SUBCUTANEOUS
  Administered 2016-03-18: 2 [IU] via SUBCUTANEOUS
  Administered 2016-03-18: 3 [IU] via SUBCUTANEOUS
  Administered 2016-03-19: 2 [IU] via SUBCUTANEOUS
  Administered 2016-03-19: 3 [IU] via SUBCUTANEOUS

## 2016-03-16 MED ORDER — AMIODARONE HCL 200 MG PO TABS
200.0000 mg | ORAL_TABLET | Freq: Two times a day (BID) | ORAL | Status: DC
  Administered 2016-03-16 – 2016-03-19 (×7): 200 mg via ORAL
  Filled 2016-03-16 (×7): qty 1

## 2016-03-16 MED ORDER — LORAZEPAM 2 MG/ML IJ SOLN
0.5000 mg | Freq: Once | INTRAMUSCULAR | Status: AC
  Administered 2016-03-16: 0.5 mg via INTRAVENOUS
  Filled 2016-03-16: qty 1

## 2016-03-16 NOTE — Clinical Social Work Placement (Signed)
   CLINICAL SOCIAL WORK PLACEMENT  NOTE  Date:  03/16/2016  Patient Details  Name: Tommy Patterson MRN: [id number] Date of Birth: 1923/11/24  Clinical Social Work is seeking post-discharge placement for this patient at the Skilled  Nursing Facility level of care (*CSW will initial, date and re-position this form in  chart as items are completed):  No   Patient/family provided with [facility] Clinical Social Work Department's list of facilities offering this level of care within the geographic area requested by the patient (or if unable, by the patient's family).  No   Patient/family informed of their freedom to choose among providers that offer the needed level of care, that participate in Medicare, Medicaid or managed care program needed by the patient, have an available bed and are willing to accept the patient.  No   Patient/family informed of [facility] ownership interest in [facility] and [facility], as well as of the fact that they are under no obligation to receive care at these facilities.  PASRR submitted to EDS on       PASRR number received on       Existing PASRR number confirmed on       FL2 transmitted to all facilities in geographic area requested by pt/family on 03/16/16     FL2 transmitted to all facilities within larger geographic area on       Patient informed that his/her managed care company has contracts with or will negotiate with certain facilities, including the following:            Patient/family informed of bed offers received.  Patient chooses bed at       Physician recommends and patient chooses bed at      Patient to be transferred to   on  .  Patient to be transferred to facility by       Patient family notified on   of transfer.  Name of family member notified:        PHYSICIAN Please sign FL2     Additional Comment:    _______________________________________________ Vaughan BrownerNixon, Emilio Baylock A, LCSW 03/16/2016, 4:15 PM

## 2016-03-16 NOTE — Progress Notes (Signed)
PROGRESS NOTE    Tommy Patterson  ZOX:[id number] DOB: 1924-02-10 DOA: 03/12/2016 PCP: Donzetta Sprung, MD   Brief Narrative:  82 yo WM PMHx of CAD s/p CABG '02, prior PCI of RCA 08/2010 with DES, s/p POBA of OM1 08/2014, DM Type 2, HTN, HLD, intolerant of beta-blockers given bradycardia last seen by Dr. [place] February 2017  Who presented to [facility] with chest pain and found to have troponin of 0.7 consistent with NSTEMI leading to transfer to [facility]. On arrival, he was noted to be hard of hearing. He knows who he is but he is a bit confused - knows hospital. His son came in and said he found him on the floor earlier tonight and said he had fallen a few times in the last week to his knowledge. He says that 2 weeks ago he passed a dementia test with "flying colors" however he is not acting like himself now. His son says he has not complained of any chest pain.    Assessment & Plan:   Principal Problem:   NSTEMI (non-ST elevated myocardial infarction) (HCC) Active Problems:   Coronary artery disease   Hypertension   Dyslipidemia   Diabetes mellitus type 2, uncontrolled (HCC)   Acute confusional state   Frequent falls   CAP (community acquired pneumonia)   Paroxysmal atrial fibrillation (HCC)   CAD in native artery   HCAP (healthcare-associated pneumonia)   Anorexia   Systolic and diastolic CHF, acute (HCC)   Controlled diabetes mellitus type 2 with complications (HCC)    NSTEMI - amlodipine, imdur, and losartan held due to low BP. BP improved after holding meds. H/o intolerance to BB due to bradycardia - unclear if related to fall, EKG no obvious ST elevation, no change compare to previous EKG RBBB - trop 0.74 --> 0.82 at [address], last trop 4.34 --> 3.25 --> 2.27. Total CK and CK MB ordered yesterday, but cancelled by lab for unknown reason. Total CPK 918 at [facility]  - trop trending down. Echo 03/12/2016 EF 25-30%, grade 2 DD, mild MR/TR - Not a good candidate  for invasive intervention.  -PT/OT; recommend SNF; has spoken with Sharl Ma (son) i and he agrees with his brother Gery Pray concerning [facility] [place] upon discharge.   Acute systolic and diastolic CHF -Strict in and out since admission + 98 ml -Daily weight Filed Weights   03/14/16 0400 03/15/16 0400 03/16/16 0500  Weight: 65 kg (143 lb 4.8 oz) 63.6 kg (140 lb 3.4 oz) 63.8 kg (140 lb 10.5 oz)  -6/29 Patient's PCXR showing Fluid overload: Start gentle diuresis  -Lasix 40 mg BID -No beta blocker secondary to intolerance: Bradycardia when used the past.  PAF - newly diagnosed( CHA2DS2-Vasc score 6) - spontaneously went into afib RVR in PM of 6/26, started on IV amiodarone, converted to NSR; currently in NSR  - DC Amiodarone drip; start Amiodarone 200 mg BID  CAD native artery - PCI/stent of the RCA in December of 2011 with DES. He is s/p POBA of OM1 in December 2015  Fall:  -patient has dementia/AMS,  - CT head and neck no acute process - AMS waxes and wanes.   PNA at OSH(HCAP) - (have not found hospital records)? PNA at OSH (have not found hospital records) -Given patient's frail condition would complete 5 day course of antibiotic -Unable to locate chest x ray image from [place].   Anorexia -Megace 800 mg daily  DM type II controlled with  complications -6/26 hemoglobin A1c= 6.9 -Increase to moderate SSI    Goals of care -Patient A/O 4 agrees that he needs to move in with one of his sons. States both sons have offered to have him move in with them. Consult social work to arrange family meeting to discuss discharge arrangement -6/ 30 spoken with Sharl MaMarty (son) and he agrees with his brother Gery PrayBarry concerning [facility] staying at [facility] [place] upon discharge     DVT prophylaxis: Lovenox Code Status: Full Family Communication:  Sharl MaMarty (son)  Disposition Plan: Patient has agreed to stay with one of his sons after SNF.   Consultants:    Dr.Peter Phillips [facility]   Procedures/Significant Events:  6/26 echocardiogram;Left ventricle:-LVEF= 25% to 30%. -(grade 2 diastolic dysfunction). - Left atrium: moderately dilated. 6/29 PCXR; worsening pulmonary edema, and right pleural effusion    Cultures 6/26 blood left AC/right hand NGTD  Antimicrobials: Zosyn 6/26>> Vancomycin 6/26>>   Devices    LINES / TUBES:      Continuous Infusions:     Subjective: 6/30  A/O 2 (does not know when, why), follows all commands.     Objective: Filed Vitals:   03/16/16 0600 03/16/16 0800 03/16/16 0825 03/16/16 1224  BP: 102/47 112/54 112/54 110/51  Pulse: 63 64 64 70  Temp:   97.4 F (36.3 C) 98.2 F (36.8 C)  TempSrc:   Oral Oral  Resp: 31 23 25 19   Height:      Weight:      SpO2: 95% 99% 91% 98%    Intake/Output Summary (Last 24 hours) at 03/16/16 1454 Last data filed at 03/16/16 1340  Gross per 24 hour  Intake 1093.2 ml  Output   3252 ml  Net -2158.8 ml   Filed Weights   03/14/16 0400 03/15/16 0400 03/16/16 0500  Weight: 65 kg (143 lb 4.8 oz) 63.6 kg (140 lb 3.4 oz) 63.8 kg (140 lb 10.5 oz)    Examination:  General: A/O 2 (does not know  when, why), NAD, cachectic, No acute respiratory distress Eyes: negative scleral hemorrhage, negative anisocoria, negative icterus ENT: Negative Runny nose, negative gingival bleeding, Neck:  Negative scars, masses, torticollis, lymphadenopathy, JVD Lungs: Clear to auscultation bilaterally, by basilar fine crackles, negative wheezes.  Cardiovascular: Irregular irregular rhythm and rate, without murmur gallop or rub normal S1 and S2 Abdomen: negative abdominal pain, nondistended, positive soft, bowel sounds, no rebound, no ascites, no appreciable mass Extremities: No significant cyanosis, clubbing, or edema bilateral lower extremities Skin: Negative rashes, lesions, ulcers Psychiatric:  Negative depression, negative anxiety, negative fatigue, negative mania   Central nervous system:  Cranial nerves II through XII intact, tongue/uvula midline, all extremities muscle strength 5/5, sensation intact throughout, negative dysarthria, negative expressive aphasia, negative receptive aphasia.  .     Data Reviewed: Care during the described time interval was provided by me .  I have reviewed this patient's available data, including medical history, events of note, physical examination, and all test results as part of my evaluation. I have personally reviewed and interpreted all radiology studies.  CBC:  Recent Labs Lab 03/12/16 0223 03/13/16 0400 03/14/16 0320 03/15/16 0214  WBC 16.3* 13.2* 12.4* 11.3*  NEUTROABS 14.5*  --  10.9*  --   HGB 13.2 12.6* 12.8* 13.5  HCT 38.5* 37.6* 38.0* 39.8  MCV 91.7 90.4 89.8 91.3  PLT 202 187 209 185   Basic Metabolic Panel:  Recent Labs Lab 03/12/16 0223 03/12/16 0804 03/14/16 0320 03/15/16 0214  NA 144 144 139 136  K 3.1* 3.1* 2.9* 3.9  CL 111 111 105 103  CO2 GLUCOSE 222* 180* 219* 177*  BUN 61* 58* 41* 28*  CREATININE 0.96 0.97 0.78 0.63  CALCIUM 9.1 9.0 8.8* 8.8*  MG 1.9  --  2.0  --    GFR: Estimated Creatinine Clearance: 50.4 mL/min (by C-G formula based on Cr of 0.63). Liver Function Tests:  Recent Labs Lab 03/12/16 0223 03/14/16 0320 03/15/16 0214  AST 85* 42* 41  ALT 40 35 36  ALKPHOS 50 56 43  BILITOT 1.3* 1.0 1.1  PROT 6.0* 5.4* 4.9*  ALBUMIN 3.2* 2.6* 2.6*   No results for input(s): LIPASE, AMYLASE in the last 168 hours.  Recent Labs Lab 03/12/16 1250  AMMONIA 30   Coagulation Profile:  Recent Labs Lab 03/12/16 0223  INR 1.50*   Cardiac Enzymes:  Recent Labs Lab 03/12/16 0223 03/12/16 0804 03/12/16 1511  TROPONINI 4.34* 3.25* 2.27*   BNP (last 3 results) No results for input(s): PROBNP in the last 8760 hours. HbA1C: No results for input(s): HGBA1C in the last 72 hours. CBG:  Recent Labs Lab 03/15/16 1216 03/15/16 1707 03/15/16 2147  03/16/16 0828 03/16/16 1229  GLUCAP 194* 193* 171* 219* 217*   Lipid Profile: No results for input(s): CHOL, HDL, LDLCALC, TRIG, CHOLHDL, LDLDIRECT in the last 72 hours. Thyroid Function Tests: No results for input(s): TSH, T4TOTAL, FREET4, T3FREE, THYROIDAB in the last 72 hours. Anemia Panel: No results for input(s): VITAMINB12, FOLATE, FERRITIN, TIBC, IRON, RETICCTPCT in the last 72 hours. Urine analysis:    Component Value Date/Time   COLORURINE YELLOW 03/15/2016 0545   APPEARANCEUR CLOUDY* 03/15/2016 0545   LABSPEC 1.026 03/15/2016 0545   PHURINE 5.5 03/15/2016 0545   GLUCOSEU NEGATIVE 03/15/2016 0545   HGBUR LARGE* 03/15/2016 0545   BILIRUBINUR NEGATIVE 03/15/2016 0545   KETONESUR 15* 03/15/2016 0545   PROTEINUR NEGATIVE 03/15/2016 0545   NITRITE NEGATIVE 03/15/2016 0545   LEUKOCYTESUR NEGATIVE 03/15/2016 0545   Sepsis Labs: (procalcitonin:4,lacticidven:4)  ) Recent Results (from the past 240 hour(s))  MRSA PCR Screening     Status: None   Collection Time: 03/12/16 12:49 AM  Result Value Ref Range Status   MRSA by PCR NEGATIVE NEGATIVE Final    Comment:        The GeneXpert MRSA Assay (FDA approved for NASAL specimens only), is one component of a comprehensive MRSA colonization surveillance program. It is not intended to diagnose MRSA infection nor to guide or monitor treatment for MRSA infections.   Culture, blood (routine x 2)     Status: None (Preliminary result)   Collection Time: 03/12/16  2:32 AM  Result Value Ref Range Status   Specimen Description BLOOD LEFT ANTECUBITAL  Final   Special Requests BOTTLES DRAWN AEROBIC AND ANAEROBIC 10CC   Final   Culture NO GROWTH 4 DAYS  Final   Report Status PENDING  Incomplete  Culture, blood (routine x 2)     Status: None (Preliminary result)   Collection Time: 03/12/16  2:38 AM  Result Value Ref Range Status   Specimen Description BLOOD RIGHT HAND  Final   Special Requests IN PEDIATRIC BOTTLE 1CC   Final   Culture NO GROWTH 4 DAYS  Final   Report Status PENDING  Incomplete         Radiology Studies: Dg Chest Port 1 View  03/15/2016  CLINICAL DATA:  Pulmonary edema EXAM: PORTABLE CHEST 1 VIEW COMPARISON:  03/14/2016 FINDINGS: Progression of diffuse bilateral airspace disease. Progression of bibasilar airspace disease due to atelectasis and effusion. Prior CABG. IMPRESSION: Worsening bilateral airspace disease consistent with pulmonary edema. Progression of bibasilar atelectasis and bilateral pleural effusions. Electronically Signed   By: Marlan Palauharles  Clark M.D.   On: 03/15/2016 07:27        Scheduled Meds: . amiodarone  200 mg Oral BID  . antiseptic oral rinse  7 mL Mouth Rinse BID  . aspirin EC  81 mg Oral Daily  . atorvastatin  80 mg Oral q1800  . cholecalciferol  1,000 Units Oral Daily  . collagenase   Topical Daily  . enoxaparin (LOVENOX) injection  40 mg Subcutaneous Q24H  . furosemide  40 mg Intravenous BID  . hydroxypropyl methylcellulose / hypromellose  1 drop Both Eyes BID  . insulin aspart  0-15 Units Subcutaneous Q4H  . ipratropium  1 spray Each Nare BID  . megestrol  800 mg Oral Daily  . multivitamin with minerals  1 tablet Oral Daily  . piperacillin-tazobactam (ZOSYN)  IV  3.375 g Intravenous Q8H  . sodium chloride flush  3 mL Intravenous Q12H  . vancomycin  500 mg Intravenous Q12H  . vitamin B-12  1,000 mcg Oral Daily   Continuous Infusions:     LOS: 4 days    Time spent: 40 minutes    WOODS, Roselind MessierURTIS J, MD [facility] Pager [phone]   If 7PM-7AM, please contact night-coverage [email or web address] Password [email or web address] 03/16/2016, 2:54 PM

## 2016-03-16 NOTE — Progress Notes (Signed)
Alerted sw bahira that pt maybe ready over weekend or Monday for snf. sw working w sons for [facility] [place].

## 2016-03-16 NOTE — Progress Notes (Signed)
Pt transferred from 2S.  Assessment completed and consistent with report received from 2S RN.  Pt and daughter in room, oriented to new environment.  Pt pleasant, but disoriented.  No complaints of pain or distress at this time.

## 2016-03-16 NOTE — Progress Notes (Signed)
Pharmacy Antibiotic Note  Tommy HaberJohn Oliver Patterson is a 82 y.o. male admitted on 03/12/2016 with pneumonia.  Pharmacy has been consulted for vancomycin and Zosyn dosing.  Plan: 1. Continue vancomycin 500 mg q 12 hrs. 2. Continue Zosyn 3.375g IV q 8 hrs. 3. F/u for duration of antibiotic therapy.  Height: 5\' 3"  (160 cm) Weight: 140 lb 10.5 oz (63.8 kg) IBW/kg (Calculated) : 56.9  Temp (24hrs), Avg:98.1 F (36.7 C), Min:97.4 F (36.3 C), Max:98.5 F (36.9 C)   Recent Labs Lab 03/12/16 0223 03/12/16 0804 03/12/16 1250 03/13/16 0400 03/13/16 2140 03/14/16 0027 03/14/16 0320 03/15/16 0214  WBC 16.3*  --   --  13.2*  --   --  12.4* 11.3*  CREATININE 0.96 0.97  --   --   --   --  0.78 0.63  LATICACIDVEN  --   --  2.2*  --  1.8 1.6  --   --     Estimated Creatinine Clearance: 50.4 mL/min (by C-G formula based on Cr of 0.63).    Allergies  Allergen Reactions  . Glimepiride Other (See Comments)    unknown  . Prednisone Other (See Comments)    increases appetite    Antimicrobials this admission: 6/26 vanc >> 6/28 Resume 6/29 > 6/26 zosyn >>  Dose adjustments this admission: n/a  Microbiology results: 6/26 blood x2- ngtd 6/26 MRSA PCR- neg   Thank you for allowing pharmacy to be a part of this patient's care.   Tad MooreJessica Antoninette Lerner, Pharm D, BCPS  Clinical Pharmacist Pager [phone]  03/16/2016 3:12 PM

## 2016-03-16 NOTE — [place] FL2 (Signed)
[place] MEDICAID FL2 LEVEL OF CARE SCREENING TOOL     IDENTIFICATION  Patient Name: Tommy Patterson Birthdate: 1923-09-21 Sex: male Admission Date (Current Location): 03/12/2016  [facility] [place]  [place]   Facility and Address:  [facility] [place], [address], [place], [place] [address]      Provider Number: [id number]  Attending Physician Name and Address:  Drema Dallasurtis J Woods, MD  Relative Name and Phone Number:       Current Level of Care: Hospital Recommended Level of Care: Skilled Nursing Facility Prior Approval Number:    Date Approved/Denied:   PASRR Number:    Discharge Plan: SNF    Current Diagnoses: Patient Active Problem List   Diagnosis Date Noted  . HCAP (healthcare-associated pneumonia)   . Anorexia   . Frequent falls   . CAP (community acquired pneumonia)   . Paroxysmal atrial fibrillation (HCC)   . CAD in native artery   . NSTEMI (non-ST elevated myocardial infarction) (HCC) 03/12/2016  . Acute confusional state 03/12/2016  . Angina pectoris (HCC) 08/18/2014  . Angina pectoris, crescendo (HCC)   . Diabetes mellitus type 2, uncontrolled (HCC) 08/10/2014  . Carotid arterial disease (HCC) 08/10/2014  . Bradycardia 08/24/2013  . Coronary artery disease   . Acute MI, anterior wall (HCC)   . Hypertension   . Dyslipidemia     Orientation RESPIRATION BLADDER Height & Weight     Self, Time  Normal Incontinent, External catheter Weight: 140 lb 10.5 oz (63.8 kg) Height:  5\' 3"  (160 cm)  BEHAVIORAL SYMPTOMS/MOOD NEUROLOGICAL BOWEL NUTRITION STATUS   (NONE )  (NONE) Incontinent Diet (CARB MODIFIED )  AMBULATORY STATUS COMMUNICATION OF NEEDS Skin   Extensive Assist Verbally Normal                       Personal Care Assistance Level of Assistance  Bathing, Feeding, Dressing Bathing Assistance: Maximum assistance Feeding assistance: Independent Dressing Assistance: Maximum assistance     Functional  Limitations Info  Speech, Hearing, Sight Sight Info: Adequate Hearing Info: Adequate Speech Info: Adequate    SPECIAL CARE FACTORS FREQUENCY  PT (By licensed PT), OT (By licensed OT)     PT Frequency: 2 OT Frequency: 2            Contractures      Additional Factors Info  Code Status, Allergies Code Status Info: FULL CODE  Allergies Info: Glimepiride, Prednisone           Current Medications (03/16/2016):  This is the current hospital active medication list Current Facility-Administered Medications  Medication Dose Route Frequency Provider Last Rate Last Dose  . 0.9 %  sodium chloride infusion  250 mL Intravenous PRN Leeann MustJacob Kelly, MD      . acetaminophen (TYLENOL) tablet 650 mg  650 mg Oral Q4H PRN Leeann MustJacob Kelly, MD   650 mg at 03/14/16 2126  . amiodarone (PACERONE) tablet 200 mg  200 mg Oral BID Drema Dallasurtis J Woods, MD   200 mg at 03/16/16 1144  . antiseptic oral rinse (CPC / CETYLPYRIDINIUM CHLORIDE 0.05%) solution 7 mL  7 mL Mouth Rinse BID Marinus MawGregg W Taylor, MD   7 mL at 03/16/16 1000  . aspirin EC tablet 81 mg  81 mg Oral Daily Leeann MustJacob Kelly, MD   81 mg at 03/16/16 [id number]  . atorvastatin (LIPITOR) tablet 80 mg  80 mg Oral q1800 Leeann MustJacob Kelly, MD   80 mg at 03/15/16 1734  .  cholecalciferol (VITAMIN D) tablet 1,000 Units  1,000 Units Oral Daily Leeann MustJacob Kelly, MD   1,000 Units at 03/16/16 [phone]  . collagenase (SANTYL) ointment   Topical Daily Drema Dallasurtis J Woods, MD      . enoxaparin (LOVENOX) injection 40 mg  40 mg Subcutaneous Q24H Lonia BloodJeffrey T McClung, MD   40 mg at 03/16/16 1146  . furosemide (LASIX) injection 40 mg  40 mg Intravenous BID Drema Dallasurtis J Woods, MD   40 mg at 03/16/16 0834  . hydroxypropyl methylcellulose / hypromellose (ISOPTO TEARS / GONIOVISC) 2.5 % ophthalmic solution 1 drop  1 drop Both Eyes BID Leeann MustJacob Kelly, MD   1 drop at 03/16/16 1110  . insulin aspart (novoLOG) injection 0-15 Units  0-15 Units Subcutaneous Q4H Drema Dallasurtis J Woods, MD   5 Units at 03/16/16 1238  . ipratropium  (ATROVENT) 0.06 % nasal spray 1 spray  1 spray Each Nare BID Leeann MustJacob Kelly, MD   1 spray at 03/16/16 1100  . megestrol (MEGACE) 400 MG/10ML suspension 800 mg  800 mg Oral Daily Drema Dallasurtis J Woods, MD   800 mg at 03/16/16 0930  . multivitamin with minerals tablet 1 tablet  1 tablet Oral Daily Leeann MustJacob Kelly, MD   1 tablet at 03/16/16 0926  . nitroGLYCERIN (NITROSTAT) SL tablet 0.4 mg  0.4 mg Sublingual Q5 Min x 3 PRN Leeann MustJacob Kelly, MD      . ondansetron [facility](ZOFRAN) injection 4 mg  4 mg Intravenous Q6H PRN Leeann MustJacob Kelly, MD      . piperacillin-tazobactam (ZOSYN) IVPB 3.375 g  3.375 g Intravenous Q8H Herby AbrahamMichelle T Bell, RPH   3.375 g at 03/16/16 0525  . sodium chloride flush (NS) 0.9 % injection 3 mL  3 mL Intravenous Q12H Leeann MustJacob Kelly, MD   3 mL at 03/16/16 1000  . sodium chloride flush (NS) 0.9 % injection 3 mL  3 mL Intravenous PRN Leeann MustJacob Kelly, MD      . vancomycin (VANCOCIN) 500 mg in sodium chloride 0.9 % 100 mL IVPB  500 mg Intravenous Q12H Drema Dallasurtis J Woods, MD   500 mg at 03/16/16 0926  . vitamin B-12 (CYANOCOBALAMIN) tablet 1,000 mcg  1,000 mcg Oral Daily Leeann MustJacob Kelly, MD   1,000 mcg at 03/16/16 [id number]     Discharge Medications: Please see discharge summary for a list of discharge medications.  Relevant Imaging Results:  Relevant Lab Results:   Additional Information SSN [id number]  Vaughan Brownerixon, Azaryah Heathcock A, LCSW

## 2016-03-16 NOTE — Progress Notes (Signed)
Physical Therapy Treatment Patient Details Name: Tommy HaberJohn Oliver Patterson MRN: [id number] DOB: Sep 02, 1924 Today's Date: 03/16/2016    History of Present Illness Pt is a 82 y/o M who [facility] with [facility] pain and found to have troponin of 0.7 consistent with NSTEMI leading [facility].  His son reports pt has fallen a few times over the past week.  2 weeks ago pt passed dementia test but concern remains for early signs of dementia.  Pt's PMH includes MI, chronic low back pain, CABG x3, Bil carpal tunnel release.    PT Comments    [name] made good progress today, ambulating 20 ft using RW and assist to steady.  Pt will benefit from continued skilled PT services to increase functional independence and safety.  Follow Up Recommendations  SNF;Supervision/Assistance - 24 hour     Equipment Recommendations  Other (comment) (TBD at next venue of care)    Recommendations for Other Services OT consult     Precautions / Restrictions Precautions Precautions: Fall Restrictions Weight Bearing Restrictions: No    Mobility  Bed Mobility Overal bed mobility: Needs Assistance Bed Mobility: Supine to Sit     Supine to sit: Min assist;+2 for physical assistance;HOB elevated     General bed mobility comments: Assist to elevate trunk and use of bed pad for positioning EOB  Transfers Overall transfer level: Needs assistance Equipment used: Rolling walker (2 wheeled) Transfers: Sit to/from Stand Sit to Stand: Min assist;+2 physical assistance;From elevated surface         General transfer comment: Assist to boost to standing.  Pt demonstrates flexed posture.  Pt was able to correct w/ verbal cues but could not sustain.    Ambulation/Gait Ambulation/Gait assistance: Min assist;+2 safety/equipment Ambulation Distance (Feet): 20 Feet Assistive device: Rolling walker (2 wheeled) Gait Pattern/deviations: Step-through pattern;Decreased stride length;Trunk  flexed   Gait velocity interpretation: Below normal speed for age/gender General Gait Details: Assist to manage RW and to steady w/ chair follow for safety.  Flexed posture, cues for upright posture and forward gaze.   Stairs            Wheelchair Mobility    Modified Rankin (Stroke Patients Only)       Balance Overall balance assessment: Needs assistance Sitting-balance support: Bilateral upper extremity supported;Feet supported Sitting balance-Leahy Scale: Fair Sitting balance - Comments: UEs supported on bed while sitting EOB   Standing balance support: Bilateral upper extremity supported;During functional activity Standing balance-Leahy Scale: Poor Standing balance comment: Relies on UE support and additional physical assist                    Cognition Arousal/Alertness: Awake/alert Behavior During Therapy: WFL for tasks assessed/performed Overall Cognitive Status: Difficult to assess                      Exercises      General Comments        Pertinent Vitals/Pain Pain Assessment: No/denies pain Pain Intervention(s): Monitored during session    Home Living                      Prior Function            PT Goals (current goals can now be found in the care plan section) Acute Rehab PT Goals Patient Stated Goal: to get stronger and feel better PT Goal Formulation: With patient Time For Goal Achievement: 03/28/16 Potential to  Achieve Goals: Good Progress towards PT goals: Progressing toward goals    Frequency  Min 2X/week    PT Plan Current plan remains appropriate    Co-evaluation             End of Session Equipment Utilized During Treatment: Gait belt Activity Tolerance: Patient tolerated treatment well Patient left: in chair;with call bell/phone within reach;with chair alarm set;with family/visitor present;with nursing/sitter in room     Time: 1115-1138 PT Time Calculation (min) (ACUTE ONLY): 23  min  Charges:  $Gait Training: 8-22 mins $Therapeutic Activity: 8-22 mins                    G Codes:       Encarnacion ChuAshley Aniqa Hare PT, DPT  Pager: [phone] Phone: [phone] 03/16/2016, 1:09 PM

## 2016-03-16 NOTE — Clinical Social Work Note (Signed)
Clinical Social Worker attempted to contact patient's family listed in contacts however left a voice message with pt's son, Gery PrayBarry in regards to referral that was made for SNF, [facility] as requested by family with RNCM.   [facility] does have beds available however CSW contacted admissions director, Cleopatra CedarChristy Meadows who has not confirmed receipt of referral.   Currently no PASARR required from [facility] if [facility] being [facility] into [place] [facility].   Clinical Social Worker will continue to follow patient and pt's family for continued support and to facilitate d/c needs once medically stable.  Derenda FennelBashira Justeen Hehr, MSW, LCSWA [phone] 03/16/2016 4:11 PM

## 2016-03-16 NOTE — Clinical Social Work Note (Signed)
[facility] has accepted and prepared to admit patient on Monday, 7/3.   CSW remains available as needed.   Derenda FennelBashira Wafaa Deemer, MSW, LCSWA [phone] 03/16/2016 4:24 PM

## 2016-03-17 DIAGNOSIS — R630 Anorexia: Secondary | ICD-10-CM

## 2016-03-17 DIAGNOSIS — J189 Pneumonia, unspecified organism: Secondary | ICD-10-CM

## 2016-03-17 DIAGNOSIS — I25118 Atherosclerotic heart disease of native coronary artery with other forms of angina pectoris: Secondary | ICD-10-CM

## 2016-03-17 DIAGNOSIS — E1165 Type 2 diabetes mellitus with hyperglycemia: Secondary | ICD-10-CM

## 2016-03-17 DIAGNOSIS — R296 Repeated falls: Secondary | ICD-10-CM

## 2016-03-17 DIAGNOSIS — I5041 Acute combined systolic (congestive) and diastolic (congestive) heart failure: Secondary | ICD-10-CM

## 2016-03-17 DIAGNOSIS — F05 Delirium due to known physiological condition: Secondary | ICD-10-CM

## 2016-03-17 DIAGNOSIS — I2510 Atherosclerotic heart disease of native coronary artery without angina pectoris: Secondary | ICD-10-CM

## 2016-03-17 LAB — CBC
HEMATOCRIT: 37.4 % — AB (ref 39.0–52.0)
HEMOGLOBIN: 12.7 g/dL — AB (ref 13.0–17.0)
MCH: 30.6 pg (ref 26.0–34.0)
MCHC: 34 g/dL (ref 30.0–36.0)
MCV: 90.1 fL (ref 78.0–100.0)
Platelets: 217 10*3/uL (ref 150–400)
RBC: 4.15 MIL/uL — AB (ref 4.22–5.81)
RDW: 13.4 % (ref 11.5–15.5)
WBC: 13 10*3/uL — AB (ref 4.0–10.5)

## 2016-03-17 LAB — MAGNESIUM: MAGNESIUM: 2.1 mg/dL (ref 1.7–2.4)

## 2016-03-17 LAB — BASIC METABOLIC PANEL
ANION GAP: 9 (ref 5–15)
BUN: 17 mg/dL (ref 6–20)
CALCIUM: 9.2 mg/dL (ref 8.9–10.3)
CO2: 32 mmol/L (ref 22–32)
Chloride: 93 mmol/L — ABNORMAL LOW (ref 101–111)
Creatinine, Ser: 0.84 mg/dL (ref 0.61–1.24)
Glucose, Bld: 172 mg/dL — ABNORMAL HIGH (ref 65–99)
POTASSIUM: 3.1 mmol/L — AB (ref 3.5–5.1)
SODIUM: 134 mmol/L — AB (ref 135–145)

## 2016-03-17 LAB — GLUCOSE, CAPILLARY
GLUCOSE-CAPILLARY: 189 mg/dL — AB (ref 65–99)
GLUCOSE-CAPILLARY: 129 mg/dL — AB (ref 65–99)
GLUCOSE-CAPILLARY: 150 mg/dL — AB (ref 65–99)
GLUCOSE-CAPILLARY: 157 mg/dL — AB (ref 65–99)
Glucose-Capillary: 128 mg/dL — ABNORMAL HIGH (ref 65–99)

## 2016-03-17 LAB — CULTURE, BLOOD (ROUTINE X 2)
Culture: NO GROWTH
Culture: NO GROWTH

## 2016-03-17 MED ORDER — LORAZEPAM 2 MG/ML IJ SOLN
0.5000 mg | Freq: Once | INTRAMUSCULAR | Status: AC
  Administered 2016-03-17: 0.5 mg via INTRAVENOUS
  Filled 2016-03-17: qty 1

## 2016-03-17 MED ORDER — POTASSIUM CHLORIDE CRYS ER 20 MEQ PO TBCR
40.0000 meq | EXTENDED_RELEASE_TABLET | Freq: Once | ORAL | Status: AC
  Administered 2016-03-17: 40 meq via ORAL
  Filled 2016-03-17: qty 2

## 2016-03-17 NOTE — Clinical Social Work Note (Signed)
Information faxed to other SNF's in case patient needs to discharge over weekend.  Charlynn CourtSarah Sayler Mickiewicz, CSW [phone]

## 2016-03-17 NOTE — Clinical Social Work Note (Signed)
Clinical Social Work Assessment  Patient Details  Name: Tommy Patterson MRN: [id number] Date of Birth: Sep 19, 1923  Date of referral:  03/16/16               Reason for consult:  Facility Placement, Discharge Planning                Permission sought to share information with:  Facility Medical sales representativeContact Representative, Family Supports Permission granted to share information::  Yes, Verbal Permission Granted  Name::     Cephas DarbyBarry Mcglasson  Agency::  [facility]  Relationship::  Son  SolicitorContact Information:  [phone]  Housing/Transportation Living arrangements for the past 2 months:  Single Family Home Source of Information:  Medical Team, Adult Children Patient Interpreter Needed:  None Criminal Activity/Legal Involvement Pertinent to Current Situation/Hospitalization:  No - Comment as needed Significant Relationships:  Adult Children Lives with:  Self Do you feel safe going back to the place where you live?  Yes Need for family participation in patient care:  Yes (Comment)  Care giving concerns:  PT recommending SNF once medically stable for discharge.   Social Worker assessment / plan:  Patient only oriented to person. CSW called patient's son, Gery PrayBarry. CSW introduced role and told patient's son that [facility] but they could not take him until Monday. Patient's son was aware of acceptance due to his wife working there. Patient's son stated that patient is looking forward to placement so that he will be able to be around other people. No further concerns. CSW encouraged patient's son to contact CSW as needed. CSW will continue to follow patient and facilitate discharge to SNF once medically stable for discharge.  Employment status:  Retired Health and safety inspectornsurance information:  Medicare PT Recommendations:  Skilled Nursing Facility Information / Referral to community resources:  Skilled Nursing Facility  Patient/Family's Response to care:  Patient oriented only to self.  Patient's son agreeable to SNF placement. Patient's family supportive and involved in patient's care. Patient's son appreciated social work intervention.  Patient/Family's Understanding of and Emotional Response to Diagnosis, Current Treatment, and Prognosis:  Patient's family understand need for rehab. Patient reportedly looking forward to placement so that he can be around other people and told his son he would help his daughter-in-law work.  Emotional Assessment Appearance:  Appears stated age Attitude/Demeanor/Rapport:  Unable to Assess Affect (typically observed):  Unable to Assess Orientation:  Oriented to Self Alcohol / Substance use:  Never Used Psych involvement (Current and /or in the community):  No (Comment)  Discharge Needs  Concerns to be addressed:  Care Coordination Readmission within the last 30 days:  No Current discharge risk:  Dependent with Mobility, Cognitively Impaired, Lives alone Barriers to Discharge:  No Barriers Identified   Margarito LinerSarah C Fotios Amos, LCSW 03/17/2016, 11:10 AM

## 2016-03-17 NOTE — Progress Notes (Signed)
PROGRESS NOTE    Tommy Patterson  ZOX:[id number] DOB: 1923/09/30 DOA: 03/12/2016 PCP: Tommy Sprung, MD   Brief Narrative:  82 yo WM PMHx of CAD s/p CABG '02, prior PCI of RCA 08/2010 with DES, s/p POBA of OM1 08/2014, DM Type 2, HTN, HLD, intolerant of beta-blockers given bradycardia last seen by Tommy Patterson February 2017  Who presented to [facility] with chest pain and found to have troponin of 0.7 consistent with NSTEMI leading to transfer to [facility]. On arrival, he was noted to be hard of hearing. He knows who he is but he is a bit confused - knows hospital. His son came in and said he found him on the floor earlier tonight and said he had fallen a few times in the last week to his knowledge. He says that 2 weeks ago he passed a dementia test with "flying colors" however he is not acting like himself now. His son says he has not complained of any chest pain.   Assessment & Plan   NSTEMI -amlodipine, imdur, and losartan held due to low BP. BP improved after holding meds. H/o intolerance to BB due to bradycardia -unclear if related to fall, EKG no obvious ST elevation, no change compare to previous EKG RBBB -trop 0.74 --> 0.82 at [facility], last trop 4.34 --> 3.25 --> 2.27. Total CK and CK MB ordered yesterday, but cancelled by lab for unknown reason. Total CPK 918 at [facility].  -trop trending down. Echo 03/12/2016 EF 25-30%, grade 2 DD, mild MR/TR -Not a good candidate for invasive intervention.  -PT/OT; recommend SNF  Acute systolic and diastolic CHF -Echocardiogram EF 25-30%, grade 2 diastolic dysfunction -Monitor daily weight, intake/output -6/29 Patient's PCXR showing Fluid overload: Start gentle diuresis  -Lasix 40 mg BID -No beta blocker secondary to intolerance: Bradycardia when used the past.  PAF - newly diagnosed( CHA2DS2-Vasc score 6) -spontaneously went into afib RVR in PM of 6/26, started on IV amiodarone, converted to NSR; currently in NSR  -DC Amiodarone  drip; continue Amiodarone 200 mg BID  CAD native artery -PCI/stent of the RCA in December of 2011 with DES. He is s/p POBA of OM1 in December 2015  Fall -Has dementia -CT head and neck no acute process -AMS waxes and wanes.   PNA at [facility]) -(have not found hospital records)? PNA at OSH (have not found hospital records) -Given patient's frail condition would complete 5 day course of antibiotic -Unable to locate chest x ray image from [place].   Anorexia -Megace 800 mg daily  DM type II controlled with complications -6/26 hemoglobin A1c= 6.9 -Increase to moderate SSI  Goals of care -Patient will move in with one of his sons when released from SNF.  -Plan for SNF on 7/3.  DVT Prophylaxis  lovenox  Code Status: Full  Family Communication: None at bedside  Disposition Plan: Admitted. Plan for discharge to SNF on 03/19/2016.  Consultants Cardiology  Procedures  Echocardiogram  Antibiotics   Anti-infectives    Start     Dose/Rate Route Frequency Ordered Stop   03/15/16 2100  vancomycin (VANCOCIN) 500 mg in sodium chloride 0.9 % 100 mL IVPB     500 mg 100 mL/hr over 60 Minutes Intravenous Every 12 hours 03/15/16 1944     03/12/16 1800  vancomycin (VANCOCIN) 500 mg in sodium chloride 0.9 % 100 mL IVPB  Status:  Discontinued     500 mg 100 mL/hr over 60 Minutes Intravenous Every 12 hours 03/12/16 0228 03/14/16  1134   03/12/16 1000  piperacillin-tazobactam (ZOSYN) IVPB 3.375 g     3.375 g 12.5 mL/hr over 240 Minutes Intravenous Every 8 hours 03/12/16 0228     03/12/16 0330  vancomycin (VANCOCIN) IVPB 1000 mg/200 mL premix     1,000 mg 200 mL/hr over 60 Minutes Intravenous  Once 03/12/16 0228 03/12/16 0421   03/12/16 0300  piperacillin-tazobactam (ZOSYN) IVPB 3.375 g     3.375 g 100 mL/hr over 30 Minutes Intravenous  Once 03/12/16 0228 03/12/16 0350      Subjective:   Tommy Patterson seen and examined today. Complains of arm soreness. Denies chest pain, shortness of  breath, abdominal pain.    Objective:   Filed Vitals:   03/16/16 1700 03/16/16 2111 03/17/16 0440 03/17/16 0442  BP: 115/40 113/49 112/47   Pulse: 70 73 69   Temp: 98.1 F (36.7 C) 98.5 F (36.9 C) 98.4 F (36.9 C)   TempSrc: Oral Axillary Oral   Resp: 18 18 18    Height:      Weight:    62.6 kg (138 lb 0.1 oz)  SpO2: 96% 93% 93%     Intake/Output Summary (Last 24 hours) at 03/17/16 1321 Last data filed at 03/16/16 1955  Gross per 24 hour  Intake    290 ml  Output    800 ml  Net   -510 ml   Filed Weights   03/15/16 0400 03/16/16 0500 03/17/16 0442  Weight: 63.6 kg (140 lb 3.4 oz) 63.8 kg (140 lb 10.5 oz) 62.6 kg (138 lb 0.1 oz)    Exam  General: Well developed, elderly, cachetic, no distress  HEENT: NCAT,mucous membranes moist.   Cardiovascular: S1 S2 auscultated, no murmurs  Respiratory:   Abdomen: Soft, nontender, nondistended, + bowel sounds  Extremities: warm dry without cyanosis clubbing or edema  Neuro: AAOx2, nonfocal. Follows commands  Data Reviewed: I have personally reviewed following labs and imaging studies  CBC:  Recent Labs Lab 03/12/16 0223 03/13/16 0400 03/14/16 0320 03/15/16 0214 03/16/16 1706 03/17/16 0241  WBC 16.3* 13.2* 12.4* 11.3* 12.8* 13.0*  NEUTROABS 14.5*  --  10.9*  --  11.0*  --   HGB 13.2 12.6* 12.8* 13.5 13.6 12.7*  HCT 38.5* 37.6* 38.0* 39.8 39.8 37.4*  MCV 91.7 90.4 89.8 91.3 90.7 90.1  PLT 202 187 209 185 214 217   Basic Metabolic Panel:  Recent Labs Lab 03/12/16 0223 03/12/16 0804 03/14/16 0320 03/15/16 0214 03/16/16 1706 03/17/16 0241  NA 144 144 139 136 134* 134*  K 3.1* 3.1* 2.9* 3.9 3.4* 3.1*  CL 111 111 105 103 93* 93*  CO2 25 23 26 25  32 32  GLUCOSE 222* 180* 219* 177* 176* 172*  BUN 61* 58* 41* 28* 15 17  CREATININE 0.96 0.97 0.78 0.63 0.87 0.84  CALCIUM 9.1 9.0 8.8* 8.8* 9.2 9.2  MG 1.9  --  2.0  --  1.7 2.1   GFR: Estimated Creatinine Clearance: 48 mL/min (by C-G formula based on Cr of  0.84). Liver Function Tests:  Recent Labs Lab 03/12/16 0223 03/14/16 0320 03/15/16 0214  AST 85* 42* 41  ALT 40 35 36  ALKPHOS 50 56 43  BILITOT 1.3* 1.0 1.1  PROT 6.0* 5.4* 4.9*  ALBUMIN 3.2* 2.6* 2.6*   No results for input(s): LIPASE, AMYLASE in the last 168 hours.  Recent Labs Lab 03/12/16 1250  AMMONIA 30   Coagulation Profile:  Recent Labs Lab 03/12/16 0223  INR 1.50*   Cardiac Enzymes:  Recent Labs Lab 03/12/16 0223 03/12/16 0804 03/12/16 1511  TROPONINI 4.34* 3.25* 2.27*   BNP (last 3 results) No results for input(s): PROBNP in the last 8760 hours. HbA1C: No results for input(s): HGBA1C in the last 72 hours. CBG:  Recent Labs Lab 03/16/16 1620 03/16/16 2031 03/17/16 0430 03/17/16 0800 03/17/16 1106  GLUCAP 196* 150* 189* 128* 129*   Lipid Profile: No results for input(s): CHOL, HDL, LDLCALC, TRIG, CHOLHDL, LDLDIRECT in the last 72 hours. Thyroid Function Tests: No results for input(s): TSH, T4TOTAL, FREET4, T3FREE, THYROIDAB in the last 72 hours. Anemia Panel: No results for input(s): VITAMINB12, FOLATE, FERRITIN, TIBC, IRON, RETICCTPCT in the last 72 hours. Urine analysis:    Component Value Date/Time   COLORURINE YELLOW 03/15/2016 0545   APPEARANCEUR CLOUDY* 03/15/2016 0545   LABSPEC 1.026 03/15/2016 0545   PHURINE 5.5 03/15/2016 0545   GLUCOSEU NEGATIVE 03/15/2016 0545   HGBUR LARGE* 03/15/2016 0545   BILIRUBINUR NEGATIVE 03/15/2016 0545   KETONESUR 15* 03/15/2016 0545   PROTEINUR NEGATIVE 03/15/2016 0545   NITRITE NEGATIVE 03/15/2016 0545   LEUKOCYTESUR NEGATIVE 03/15/2016 0545   Sepsis Labs: @LABRCNTIP (procalcitonin:4,lacticidven:4)  ) Recent Results (from the past 240 hour(s))  MRSA PCR Screening     Status: None   Collection Time: 03/12/16 12:49 AM  Result Value Ref Range Status   MRSA by PCR NEGATIVE NEGATIVE Final    Comment:        The GeneXpert MRSA Assay (FDA approved for NASAL specimens only), is one component  of a comprehensive MRSA colonization surveillance program. It is not intended to diagnose MRSA infection nor to guide or monitor treatment for MRSA infections.   Culture, blood (routine x 2)     Status: None (Preliminary result)   Collection Time: 03/12/16  2:32 AM  Result Value Ref Range Status   Specimen Description BLOOD LEFT ANTECUBITAL  Final   Special Requests BOTTLES DRAWN AEROBIC AND ANAEROBIC 10CC   Final   Culture NO GROWTH 4 DAYS  Final   Report Status PENDING  Incomplete  Culture, blood (routine x 2)     Status: None (Preliminary result)   Collection Time: 03/12/16  2:38 AM  Result Value Ref Range Status   Specimen Description BLOOD RIGHT HAND  Final   Special Requests IN PEDIATRIC BOTTLE 1CC  Final   Culture NO GROWTH 4 DAYS  Final   Report Status PENDING  Incomplete      Radiology Studies: No results found.   Scheduled Meds: . amiodarone  200 mg Oral BID  . antiseptic oral rinse  7 mL Mouth Rinse BID  . aspirin EC  81 mg Oral Daily  . atorvastatin  80 mg Oral q1800  . cholecalciferol  1,000 Units Oral Daily  . collagenase   Topical Daily  . enoxaparin (LOVENOX) injection  40 mg Subcutaneous Q24H  . furosemide  40 mg Intravenous BID  . hydroxypropyl methylcellulose / hypromellose  1 drop Both Eyes BID  . insulin aspart  0-15 Units Subcutaneous Q4H  . ipratropium  1 spray Each Nare BID  . megestrol  800 mg Oral Daily  . multivitamin with minerals  1 tablet Oral Daily  . piperacillin-tazobactam (ZOSYN)  IV  3.375 g Intravenous Q8H  . sodium chloride flush  3 mL Intravenous Q12H  . vancomycin  500 mg Intravenous Q12H  . vitamin B-12  1,000 mcg Oral Daily   Continuous Infusions:    LOS: 5 days   Time Spent in minutes  30 minutes  Ilea Hilton D.O. on 03/17/2016 at 1:21 PM  Between 7am to 7pm - Pager - [phone]  After 7pm go to [email or web address] - password [email or web address]  And look for the night coverage person covering for me after hours  [facility] Office  [phone]

## 2016-03-18 ENCOUNTER — Inpatient Hospital Stay (HOSPITAL_COMMUNITY): Payer: Medicare Other

## 2016-03-18 ENCOUNTER — Encounter (HOSPITAL_COMMUNITY): Payer: Self-pay | Admitting: Radiology

## 2016-03-18 DIAGNOSIS — I10 Essential (primary) hypertension: Secondary | ICD-10-CM

## 2016-03-18 DIAGNOSIS — E785 Hyperlipidemia, unspecified: Secondary | ICD-10-CM

## 2016-03-18 DIAGNOSIS — E118 Type 2 diabetes mellitus with unspecified complications: Secondary | ICD-10-CM

## 2016-03-18 DIAGNOSIS — R0602 Shortness of breath: Secondary | ICD-10-CM | POA: Insufficient documentation

## 2016-03-18 LAB — GLUCOSE, CAPILLARY
Glucose-Capillary: 112 mg/dL — ABNORMAL HIGH (ref 65–99)
Glucose-Capillary: 114 mg/dL — ABNORMAL HIGH (ref 65–99)
Glucose-Capillary: 126 mg/dL — ABNORMAL HIGH (ref 65–99)
Glucose-Capillary: 131 mg/dL — ABNORMAL HIGH (ref 65–99)
Glucose-Capillary: 174 mg/dL — ABNORMAL HIGH (ref 65–99)
Glucose-Capillary: 96 mg/dL (ref 65–99)

## 2016-03-18 LAB — BASIC METABOLIC PANEL
ANION GAP: 9 (ref 5–15)
BUN: 15 mg/dL (ref 6–20)
CHLORIDE: 92 mmol/L — AB (ref 101–111)
CO2: 32 mmol/L (ref 22–32)
CREATININE: 0.96 mg/dL (ref 0.61–1.24)
Calcium: 9.5 mg/dL (ref 8.9–10.3)
GFR calc non Af Amer: >60 mL/min (ref >60)
Glucose, Bld: 112 mg/dL — ABNORMAL HIGH (ref 65–99)
Potassium: 3.4 mmol/L — ABNORMAL LOW (ref 3.5–5.1)
SODIUM: 133 mmol/L — AB (ref 135–145)

## 2016-03-18 LAB — CBC
HCT: 39.4 % (ref 39.0–52.0)
HEMOGLOBIN: 13.4 g/dL (ref 13.0–17.0)
MCH: 30.6 pg (ref 26.0–34.0)
MCHC: 34 g/dL (ref 30.0–36.0)
MCV: 90 fL (ref 78.0–100.0)
PLATELETS: 250 10*3/uL (ref 150–400)
RBC: 4.38 MIL/uL (ref 4.22–5.81)
RDW: 13.4 % (ref 11.5–15.5)
WBC: 13.4 10*3/uL — AB (ref 4.0–10.5)

## 2016-03-18 LAB — MAGNESIUM: MAGNESIUM: 1.7 mg/dL (ref 1.7–2.4)

## 2016-03-18 MED ORDER — MAGNESIUM SULFATE 2 GM/50ML IV SOLN
2.0000 g | Freq: Once | INTRAVENOUS | Status: AC
  Administered 2016-03-18: 2 g via INTRAVENOUS
  Filled 2016-03-18 (×2): qty 50

## 2016-03-18 MED ORDER — LORAZEPAM 2 MG/ML IJ SOLN
1.0000 mg | Freq: Once | INTRAMUSCULAR | Status: DC
  Filled 2016-03-18: qty 1

## 2016-03-18 MED ORDER — POTASSIUM CHLORIDE CRYS ER 20 MEQ PO TBCR
40.0000 meq | EXTENDED_RELEASE_TABLET | Freq: Once | ORAL | Status: AC
Start: 2016-03-18 — End: 2016-03-18
  Administered 2016-03-18: 40 meq via ORAL
  Filled 2016-03-18: qty 2

## 2016-03-18 NOTE — Progress Notes (Signed)
   03/17/16 2049  What Happened  Was fall witnessed? No  Was patient injured? Unsure  Patient found on floor  Found by Staff-comment  Stated prior activity ambulating-unassisted  Follow Up  MD notified Lance CoonLaura Harduk  Time MD notified 2110  Family notified Yes-comment  Time family notified 2125  Additional tests Yes-comment  Adult Fall Risk Assessment  Risk Factor Category (scoring not indicated) High fall risk per protocol (document High fall risk);History of more than one fall within 6 months before admission (document High fall risk)  Patient's Fall Risk High Fall Risk (>13 points)  Adult Fall Risk Interventions  Required Bundle Interventions *See Row Information* High fall risk - low, moderate, and high requirements implemented  Additional Interventions Camera surveillance (with patient/family notification & education);Individualized elimination schedule;Room near nurses station;Safety Sitter/Safety Rounder;Reorient/diversional activities with confused patients  Screening for Fall Injury Risk  Risk For Fall Injury- See Row Information  D;B;F;Nurse judgement;A  Injury Prevention Interventions Camera surveillance (with patient/family notification & education);Safety Sitter/Safety Rounder  Vitals  Temp Source Oral  BP (!) 121/36 mmHg  BP Location Left Arm  BP Method Automatic  Patient Position (if appropriate) Lying  Pulse Rate 87  Pulse Rate Source Dinamap  Resp 18  Oxygen Therapy  SpO2 97 %  O2 Device Room Air  Pain Assessment  Pain Assessment 0-10  Pain Score 4  Pain Type Acute pain  Pain Location Back (from previous fall on back from home)  Pain Intervention(s) Emotional support;Environmental changes;Rest;Relaxation;Therapeutic touch

## 2016-03-18 NOTE — Progress Notes (Signed)
Patient fell around 2050. NT found patient on the floor laying on his right side. Vitals were taken and were found to be stable. MD was paged. Family was called. Patient was moved to 2W26 (camera room). Safety sitter ordered but none available. MD assessed patient later and ordered further tests. Will continue to monitor.

## 2016-03-18 NOTE — Progress Notes (Signed)
PROGRESS NOTE    Tommy Patterson  UJW:[id number] DOB: [birth year] DOA: 03/12/2016 PCP: [name], [name], MD   Brief Narrative:  82 yo WM PMHx of CAD s/p CABG '02, prior PCI of RCA 08/2010 with DES, s/p POBA of OM1 08/2014, DM Type 2, HTN, HLD, intolerant of beta-blockers given bradycardia last seen by Dr. [place] February 2017  Who [facility] with chest pain and found to have troponin of 0.7 consistent with NSTEMI leading to transfer to [facility]. On arrival, he was noted to be hard of hearing. He knows who he is but he is a bit confused - knows hospital. His son came in and said he found him on the floor earlier tonight and said he had fallen a few times in the last week to his knowledge. He says that 2 weeks ago he passed a dementia test with "flying colors" however he is not acting like himself now. His son says he has not complained of any chest pain.   Assessment & Plan   NSTEMI -amlodipine, imdur, and losartan held due to low BP. BP improved after holding meds. H/o intolerance to BB due to bradycardia -unclear if related to fall, EKG no obvious ST elevation, no change compare to previous EKG RBBB -trop 0.74 --> 0.[facility], last trop 4.34 --> 3.25 --> 2.27. Total CK and CK MB ordered yesterday, but cancelled by lab for unknown reason. [facility] CPK 918 [facility].  -trop trending down. Echo 03/12/2016 EF 25-30%, grade 2 DD, mild MR/TR -Not a good candidate for invasive intervention.  -PT/OT recommend SNF  Acute systolic and diastolic CHF -Echocardiogram EF 25-30%, grade 2 diastolic dysfunction -Monitor daily weight, intake/output -6/29 Patient's PCXR showing Fluid overload: Start gentle diuresis  -Lasix 40 mg BID -No beta blocker secondary to intolerance: Bradycardia when used the past.  PAF - newly diagnosed( CHA2DS2-Vasc score 6) -spontaneously went into afib RVR in PM of 6/26, started on IV amiodarone, converted to NSR; currently in NSR  -DC Amiodarone  drip; continue Amiodarone 200 mg BID  CAD native artery -PCI/stent of the RCA in December of 2011 with DES. He is s/p POBA of OM1 in December 2015  Fall -Has dementia -CT head and neck no acute process -AMS waxes and wanes.   [facility] -(have not found hospital records)? PNA at OSH (have not found hospital records) -Given patient's frail condition would complete 5 day course of antibiotic -Unable to locate [facility].   Anorexia -Megace 800 mg daily  DM type II controlled with complications -6/26 hemoglobin A1c= 6.9 -Increase to moderate SSI  Goals of care -Patient will move in with one of his sons when released from SNF.  -Plan for SNF on 7/3.  DVT Prophylaxis  lovenox  Code Status: Full  Family Communication: None at bedside  Disposition Plan: Admitted. Plan for discharge to SNF on 03/19/2016.  Consultants Cardiology  Procedures  Echocardiogram  Antibiotics   Anti-infectives    Start     Dose/Rate Route Frequency Ordered Stop   03/15/16 2100  vancomycin (VANCOCIN) 500 mg in sodium chloride 0.9 % 100 mL IVPB     500 mg 100 mL/hr over 60 Minutes Intravenous Every 12 hours 03/15/16 1944     03/12/16 1800  vancomycin (VANCOCIN) 500 mg in sodium chloride 0.9 % 100 mL IVPB  Status:  Discontinued     500 mg 100 mL/hr over 60 Minutes Intravenous Every 12 hours 03/12/16 0228 03/14/16  1134   03/12/16 1000  piperacillin-tazobactam (ZOSYN) IVPB 3.375 g     3.375 g 12.5 mL/hr over 240 Minutes Intravenous Every 8 hours 03/12/16 0228     03/12/16 0330  vancomycin (VANCOCIN) IVPB 1000 mg/200 mL premix     1,000 mg 200 mL/hr over 60 Minutes Intravenous  Once 03/12/16 0228 03/12/16 0421   03/12/16 0300  piperacillin-tazobactam (ZOSYN) IVPB 3.375 g     3.375 g 100 mL/hr over 30 Minutes Intravenous  Once 03/12/16 0228 03/12/16 0350      Subjective:   [name] seen and examined today. Patient states he has been taking lots of pills this morning.  Denies chest pain, shortness of breath, abdominal pain.    Objective:   Filed Vitals:   03/17/16 2049 03/18/16 0415 03/18/16 0500 03/18/16 0750  BP: 121/36 112/53  130/47  Pulse: 87 70  67  Temp:      TempSrc: Oral     Resp: 18     Height:      Weight:   64.5 kg (142 lb 3.2 oz)   SpO2: 97%      No intake or output data in the 24 hours ending 03/18/16 1111 Filed Weights   03/16/16 0500 03/17/16 0442 03/18/16 0500  Weight: 63.8 kg (140 lb 10.5 oz) 62.6 kg (138 lb 0.1 oz) 64.5 kg (142 lb 3.2 oz)    Exam  General: Well developed, elderly, cachetic, no distress  HEENT: NCAT,mucous membranes moist.   Cardiovascular: S1 S2 auscultated, no murmurs  Respiratory: Diminished but clear, no wheezing.   Abdomen: Soft, nontender, nondistended, + bowel sounds  Extremities: warm dry without cyanosis clubbing or edema  Neuro: AAOx2 (self and place), nonfocal. Follows commands  Psych: Appropriate mood and affect, pleasant  Data Reviewed: I have personally reviewed following labs and imaging studies  CBC:  Recent Labs Lab 03/12/16 0223  03/14/16 0320 03/15/16 0214 03/16/16 1706 03/17/16 0241 03/18/16 0247  WBC 16.3*  < > 12.4* 11.3* 12.8* 13.0* 13.4*  NEUTROABS 14.5*  --  10.9*  --  11.0*  --   --   HGB 13.2  < > 12.8* 13.5 13.6 12.7* 13.4  HCT 38.5*  < > 38.0* 39.8 39.8 37.4* 39.4  MCV 91.7  < > 89.8 91.3 90.7 90.1 90.0  PLT 202  < > 209 185 214 217 250  < > = values in this interval not displayed. Basic Metabolic Panel:  Recent Labs Lab 03/12/16 0223  03/14/16 0320 03/15/16 0214 03/16/16 1706 03/17/16 0241 03/18/16 0247  NA 144  < > 139 136 134* 134* 133*  K 3.1*  < > 2.9* 3.9 3.4* 3.1* 3.4*  CL 111  < > 105 103 93* 93* 92*  CO2 25  < > 26 25 32 32 32  GLUCOSE 222*  < > 219* 177* 176* 172* 112*  BUN 61*  < > 41* 28* 15 17 15   CREATININE 0.96  < > 0.78 0.63 0.87 0.84 0.96  CALCIUM 9.1  < > 8.8* 8.8* 9.2 9.2 9.5  MG 1.9  --  2.0  --  1.7 2.1 1.7  < > = values  in this interval not displayed. GFR: Estimated Creatinine Clearance: 42 mL/min (by C-G formula based on Cr of 0.96). Liver Function Tests:  Recent Labs Lab 03/12/16 0223 03/14/16 0320 03/15/16 0214  AST 85* 42* 41  ALT 40 35 36  ALKPHOS 50 56 43  BILITOT 1.3* 1.0 1.1  PROT 6.0* 5.4* 4.9*  ALBUMIN 3.2* 2.6* 2.6*   No results for input(s): LIPASE, AMYLASE in the last 168 hours.  Recent Labs Lab 03/12/16 1250  AMMONIA 30   Coagulation Profile:  Recent Labs Lab 03/12/16 0223  INR 1.50*   Cardiac Enzymes:  Recent Labs Lab 03/12/16 0223 03/12/16 0804 03/12/16 1511  TROPONINI 4.34* 3.25* 2.27*   BNP (last 3 results) No results for input(s): PROBNP in the last 8760 hours. HbA1C: No results for input(s): HGBA1C in the last 72 hours. CBG:  Recent Labs Lab 03/17/16 1627 03/17/16 2023 03/18/16 0017 03/18/16 0409 03/18/16 0748  GLUCAP 150* 157* 131* 126* 96   Lipid Profile: No results for input(s): CHOL, HDL, LDLCALC, TRIG, CHOLHDL, LDLDIRECT in the last 72 hours. Thyroid Function Tests: No results for input(s): TSH, T4TOTAL, FREET4, T3FREE, THYROIDAB in the last 72 hours. Anemia Panel: No results for input(s): VITAMINB12, FOLATE, FERRITIN, TIBC, IRON, RETICCTPCT in the last 72 hours. Urine analysis:    Component Value Date/Time   COLORURINE YELLOW 03/15/2016 0545   APPEARANCEUR CLOUDY* 03/15/2016 0545   LABSPEC 1.026 03/15/2016 0545   PHURINE 5.5 03/15/2016 0545   GLUCOSEU NEGATIVE 03/15/2016 0545   HGBUR LARGE* 03/15/2016 0545   BILIRUBINUR NEGATIVE 03/15/2016 0545   KETONESUR 15* 03/15/2016 0545   PROTEINUR NEGATIVE 03/15/2016 0545   NITRITE NEGATIVE 03/15/2016 0545   LEUKOCYTESUR NEGATIVE 03/15/2016 0545   Sepsis Labs: (procalcitonin:4,lacticidven:4)  ) Recent Results (from the past 240 hour(s))  MRSA PCR Screening     Status: None   Collection Time: 03/12/16 12:49 AM  Result Value Ref Range Status   MRSA by PCR NEGATIVE NEGATIVE  Final    Comment:        The GeneXpert MRSA Assay (FDA approved for NASAL specimens only), is one component of a comprehensive MRSA colonization surveillance program. It is not intended to diagnose MRSA infection nor to guide or monitor treatment for MRSA infections.   Culture, blood (routine x 2)     Status: None   Collection Time: 03/12/16  2:32 AM  Result Value Ref Range Status   Specimen Description BLOOD LEFT ANTECUBITAL  Final   Special Requests BOTTLES DRAWN AEROBIC AND ANAEROBIC 10CC   Final   Culture NO GROWTH 5 DAYS  Final   Report Status 03/17/2016 FINAL  Final  Culture, blood (routine x 2)     Status: None   Collection Time: 03/12/16  2:38 AM  Result Value Ref Range Status   Specimen Description BLOOD RIGHT HAND  Final   Special Requests IN PEDIATRIC BOTTLE 1CC  Final   Culture NO GROWTH 5 DAYS  Final   Report Status 03/17/2016 FINAL  Final      Radiology Studies: Ct Head Wo Contrast  03/18/2016  CLINICAL DATA:  82 year old male with fall EXAM: CT HEAD WITHOUT CONTRAST CT CERVICAL SPINE WITHOUT CONTRAST TECHNIQUE: Multidetector CT imaging of the head and cervical spine was performed following the standard protocol without intravenous contrast. Multiplanar CT image reconstructions of the cervical spine were also generated. COMPARISON:  Head CT dated 06/25 7 FINDINGS: CT HEAD FINDINGS Moderate age-related atrophy and chronic microvascular ischemic changes. There is no acute intracranial hemorrhage. No mass effect or midline shift noted. The visualized paranasal sinuses and mastoid air cells are clear. The calvarium is intact. CT CERVICAL SPINE FINDINGS There is no acute fracture or subluxation of the cervical spine.There is osteopenia with extensive multilevel degenerative changes of the spine and facet hypertrophy.The odontoid and spinous processes are intact.There is normal anatomic  alignment of the C1-C2 lateral masses. The visualized soft tissues appear unremarkable.  Partially visualized bilateral pleural effusions. IMPRESSION: No acute intracranial hemorrhage. No acute/ traumatic cervical spine pathology. Electronically Signed   By: Elgie CollardArash  Radparvar M.D.   On: 03/18/2016 02:27   Ct Cervical Spine Wo Contrast  03/18/2016  CLINICAL DATA:  82 year old male with fall EXAM: CT HEAD WITHOUT CONTRAST CT CERVICAL SPINE WITHOUT CONTRAST TECHNIQUE: Multidetector CT imaging of the head and cervical spine was performed following the standard protocol without intravenous contrast. Multiplanar CT image reconstructions of the cervical spine were also generated. COMPARISON:  Head CT dated 06/25 7 FINDINGS: CT HEAD FINDINGS Moderate age-related atrophy and chronic microvascular ischemic changes. There is no acute intracranial hemorrhage. No mass effect or midline shift noted. The visualized paranasal sinuses and mastoid air cells are clear. The calvarium is intact. CT CERVICAL SPINE FINDINGS There is no acute fracture or subluxation of the cervical spine.There is osteopenia with extensive multilevel degenerative changes of the spine and facet hypertrophy.The odontoid and spinous processes are intact.There is normal anatomic alignment of the C1-C2 lateral masses. The visualized soft tissues appear unremarkable. Partially visualized bilateral pleural effusions. IMPRESSION: No acute intracranial hemorrhage. No acute/ traumatic cervical spine pathology. Electronically Signed   By: Elgie CollardArash  Radparvar M.D.   On: 03/18/2016 02:27   Dg Lumbar Spine 2-3vclearing  03/18/2016  CLINICAL DATA:  82 year old male with fall and back pain. EXAM: LIMITED LUMBAR SPINE FOR TRAUMA CLEARING - 2-3 VIEW COMPARISON:  None. FINDINGS: No definite acute fracture or subluxation. There is advanced osteopenia which limits evaluation for fracture. The visualized spinous processes appear intact. There is atherosclerotic calcification of the abdominal aorta. Air-filled stomach and loops of small bowel. IMPRESSION: No definite  acute/traumatic lumbar spine pathology. Advanced osteopenia. Electronically Signed   By: Elgie CollardArash  Radparvar M.D.   On: 03/18/2016 03:32     Scheduled Meds: . amiodarone  200 mg Oral BID  . antiseptic oral rinse  7 mL Mouth Rinse BID  . aspirin EC  81 mg Oral Daily  . atorvastatin  80 mg Oral q1800  . cholecalciferol  1,000 Units Oral Daily  . collagenase   Topical Daily  . enoxaparin (LOVENOX) injection  40 mg Subcutaneous Q24H  . furosemide  40 mg Intravenous BID  . hydroxypropyl methylcellulose / hypromellose  1 drop Both Eyes BID  . insulin aspart  0-15 Units Subcutaneous Q4H  . ipratropium  1 spray Each Nare BID  . LORazepam  1 mg Intravenous Once  . megestrol  800 mg Oral Daily  . multivitamin with minerals  1 tablet Oral Daily  . piperacillin-tazobactam (ZOSYN)  IV  3.375 g Intravenous Q8H  . sodium chloride flush  3 mL Intravenous Q12H  . vancomycin  500 mg Intravenous Q12H  . vitamin B-12  1,000 mcg Oral Daily   Continuous Infusions:    LOS: 6 days   Time Spent in minutes   30 minutes  Joaquina Nissen D.O. on 03/18/2016 at 11:11 AM  Between 7am to 7pm - Pager - [phone]  After 7pm go to [email or web address] - password [email or web address]  And look for the night coverage person covering for me after hours  [facility] Office  [phone]

## 2016-03-19 LAB — BASIC METABOLIC PANEL
Anion gap: 17 — ABNORMAL HIGH (ref 5–15)
BUN: 17 mg/dL (ref 6–20)
CALCIUM: 10.5 mg/dL — AB (ref 8.9–10.3)
CO2: 29 mmol/L (ref 22–32)
CREATININE: 0.93 mg/dL (ref 0.61–1.24)
Chloride: 90 mmol/L — ABNORMAL LOW (ref 101–111)
GFR calc Af Amer: >60 mL/min (ref >60)
GLUCOSE: 152 mg/dL — AB (ref 65–99)
Potassium: 3.8 mmol/L (ref 3.5–5.1)
SODIUM: 136 mmol/L (ref 135–145)

## 2016-03-19 LAB — MAGNESIUM: Magnesium: 2.3 mg/dL (ref 1.7–2.4)

## 2016-03-19 LAB — CBC
HEMATOCRIT: 37.8 % — AB (ref 39.0–52.0)
Hemoglobin: 12.8 g/dL — ABNORMAL LOW (ref 13.0–17.0)
MCH: 30.5 pg (ref 26.0–34.0)
MCHC: 33.9 g/dL (ref 30.0–36.0)
MCV: 90.2 fL (ref 78.0–100.0)
PLATELETS: 266 10*3/uL (ref 150–400)
RBC: 4.19 MIL/uL — ABNORMAL LOW (ref 4.22–5.81)
RDW: 13.6 % (ref 11.5–15.5)
WBC: 10.5 10*3/uL (ref 4.0–10.5)

## 2016-03-19 LAB — GLUCOSE, CAPILLARY
GLUCOSE-CAPILLARY: 125 mg/dL — AB (ref 65–99)
GLUCOSE-CAPILLARY: 155 mg/dL — AB (ref 65–99)
Glucose-Capillary: 156 mg/dL — ABNORMAL HIGH (ref 65–99)
Glucose-Capillary: 111 mg/dL — ABNORMAL HIGH (ref 65–99)

## 2016-03-19 MED ORDER — MEGESTROL ACETATE 400 MG/10ML PO SUSP: 800.0000 mg | Freq: Every day | ORAL | Status: AC

## 2016-03-19 MED ORDER — ADULT MULTIVITAMIN W/MINERALS CH: 1.0000 | ORAL_TABLET | Freq: Every day | ORAL | Status: AC

## 2016-03-19 MED ORDER — ACETAMINOPHEN 325 MG PO TABS: 650.0000 mg | ORAL_TABLET | ORAL | Status: AC | PRN

## 2016-03-19 MED ORDER — POTASSIUM CHLORIDE ER 10 MEQ PO TBCR: 10.0000 meq | EXTENDED_RELEASE_TABLET | Freq: Every day | ORAL | Status: AC

## 2016-03-19 MED ORDER — ATORVASTATIN CALCIUM 80 MG PO TABS: 80.0000 mg | ORAL_TABLET | Freq: Every day | ORAL | Status: AC

## 2016-03-19 MED ORDER — FUROSEMIDE 40 MG PO TABS: 40.0000 mg | ORAL_TABLET | Freq: Two times a day (BID) | ORAL | Status: AC

## 2016-03-19 MED ORDER — AMIODARONE HCL 200 MG PO TABS: 200.0000 mg | ORAL_TABLET | Freq: Two times a day (BID) | ORAL | Status: AC

## 2016-03-19 NOTE — Discharge Summary (Addendum)
Physician Discharge Summary  Ervan Heber ZOX:[id number] DOB: Nov 27, 1923 DOA: 03/12/2016  PCP: Donzetta Sprung, MD  Admit date: 03/12/2016 Discharge date: 03/19/2016  Time spent: 45 minutes  Recommendations for Outpatient Follow-up:  Patient will be discharged to [facility] nursing facility. Continue physical and occupational therapy.  Patient will need to follow up with primary care provider within one week of discharge, repeat BMP. Follow up with cardiology, Dr. Swaziland in 2 weeks. Patient should continue medications as prescribed.  Patient should follow a heart healthy/carb modified diet.   Discharge Diagnoses:  NSTEMI Acute systolic and diastolic CHF PAF - newly diagnosed( CHA2DS2-Vasc score 6) CAD native artery Fall PNA at [facility]) Anorexia DM type II controlled with complications Goals of care  Discharge Condition: Stable  Diet recommendation: heart healthy/carb modified   Filed Weights   03/17/16 0442 03/18/16 0500 03/19/16 0453  Weight: 62.6 kg (138 lb 0.1 oz) 64.5 kg (142 lb 3.2 oz) 62.3 kg (137 lb 5.6 oz)    History of present illness:  On 03/12/2016 by Dr. Leeann Must (cardiology) Mr. Mackowski is an 82 yo man with PMH of CAD s/p CABG '02, prior PCI of RCA 08/2010 with DES, s/p POBA of OM1 08/2014, T2DM, hypertension and dyslipidemia, intolerant of beta-blockers given bradycardia last seen by Dr. Swaziland February 2017 who presented to [facility] with chest pain and found to have troponin of 0.7 consistent with NSTEMI leading to transfer to [facility]. On arrival, he was noted to be hard of hearing. He knows who he is but he is a bit confused - knows hospital. His son came in and said he found him on the floor earlier tonight and said he had fallen a few times in the last week to his knowledge. He says that 2 weeks ago he passed a dementia test with "flying colors" however he is not acting like himself now. His son says he has not complained of any chest pain.  He  denies chest pain, no fever/chills/nausea/vomiting/diarrhea. + falls.   Hospital Course:  NSTEMI -amlodipine, imdur, and losartan held due to low BP. BP improved after holding meds. H/o intolerance to BB due to bradycardia -unclear if related to fall, EKG no obvious ST elevation, no change compare to previous EKG RBBB -trop 0.74 --> 0.82 at [facility], last trop 4.34 --> 3.25 --> 2.27. Total CK and CK MB ordered, but cancelled by lab for unknown reason. Total CPK 918 at [facility].  -trop trending down. Echo 03/12/2016 EF 25-30%, grade 2 DD, mild MR/TR -Not a good candidate for invasive intervention.  -PT/OT recommend SNF   Acute systolic and diastolic CHF -Echocardiogram EF 25-30%, grade 2 diastolic dysfunction -Monitor daily weight, intake/output -6/29 Patient's PCXR showing Fluid overload:  gentle diuresis  -Lasix 40 mg BID, will discharge with PO lasix and potassium supplementation -No beta blocker secondary to intolerance: Bradycardia when used the past. -losartan held due to soft BP.   PAF - newly diagnosed( CHA2DS2-Vasc score 6) -spontaneously went into afib RVR in PM of 6/26, started on IV amiodarone, converted to NSR; currently in NSR  -DC Amiodarone drip; continue Amiodarone 200 mg BID -Follow up with cardiology in 2 weeks for titration of amiodarone   CAD native artery -PCI/stent of the RCA in December of 2011 with DES. He is s/p POBA of OM1 in December 2015  Fall -Has dementia -CT head and neck no acute process -AMS waxes and wanes.   [facility] at [facility] -(have not found hospital records)? [facility] (have not found hospital records) -Given patient's frail condition would complete 5 day course of antibiotic -Unable to locate [facility].  -Patient completed antibiotic course during hospitalization   Anorexia -Megace 800 mg daily  DM type II controlled with complications -6/26 hemoglobin A1c= 6.9 -Increase to moderate SSI  Goals of  care -Patient will move in with one of his sons when released from SNF.  -Plan for SNF on 7/3.  Consultants Cardiology  Procedures  Echocardiogram  Discharge Exam: Filed Vitals:   03/18/16 2000 03/19/16 0453  BP: 116/53 102/68  Pulse: 74 68  Temp: 98 F (36.7 C) 98.2 F (36.8 C)  Resp: 18 18   Exam  General: Well developed, elderly, cachetic, no distress  HEENT: NCAT,mucous membranes moist.   Cardiovascular: S1 S2 auscultated, no murmurs  Respiratory: Diminished but clear with equal chest rise, no wheezing.  Abdomen: Soft, nontender, nondistended, + bowel sounds  Extremities: warm dry without cyanosis clubbing or edema  Neuro: AAOx2 (self and place), nonfocal. Hard of hearing  Psych: Appropriate mood and affect, pleasant  Discharge Instructions      Discharge Instructions    Discharge instructions    Complete by:  As directed   [facility]. Continue physical and occupational therapy.  Patient will need to follow up with primary care provider within one week of discharge, repeat BMP. Follow up with cardiology, Dr. SwazilandJordan in 2 weeks. Patient should continue medications as prescribed.  Patient should follow a heart healthy/carb modified diet.            Medication List    STOP taking these medications        isosorbide mononitrate 60 MG 24 hr tablet  Commonly known as:  IMDUR     losartan 100 MG tablet  Commonly known as:  COZAAR      TAKE these medications        acetaminophen 325 MG tablet  Commonly known as:  TYLENOL  Take 2 tablets (650 mg total) by mouth every 4 (four) hours as needed for headache or mild pain.     amiodarone 200 MG tablet  Commonly known as:  PACERONE  Take 1 tablet (200 mg total) by mouth 2 (two) times daily.     aspirin 81 MG tablet  Take 81 mg by mouth daily.     atorvastatin 80 MG tablet  Commonly known as:  LIPITOR  Take 1 tablet (80 mg total) by mouth daily at 6 PM.      COQ-10 PO  Take 100 mg by mouth 2 (two) times daily.     furosemide 40 MG tablet  Commonly known as:  LASIX  Take 1 tablet (40 mg total) by mouth 2 (two) times daily.     LUTEIN PO  Take 1 capsule by mouth every morning.     megestrol 400 MG/10ML suspension  Commonly known as:  MEGACE  Take 20 mLs (800 mg total) by mouth daily.     metFORMIN 500 MG tablet  Commonly known as:  GLUCOPHAGE  Take 500 mg by mouth daily.     multivitamin with minerals Tabs tablet  Take 1 tablet by mouth daily.     nitroGLYCERIN 0.4 MG SL tablet  Commonly known as:  NITROSTAT  Place 0.4 mg under the tongue every 5 (five) minutes as needed.     OVER THE COUNTER MEDICATION  Place 1 spray into both nostrils daily. "  OTC Nasal Spray"     polyethylene glycol packet  Commonly known as:  MIRALAX / GLYCOLAX  Take 17 g by mouth daily as needed for mild constipation.     potassium chloride 10 MEQ tablet  Commonly known as:  K-DUR  Take 1 tablet (10 mEq total) by mouth daily.     PROSTATE PO  Take 3 capsules by mouth at bedtime.     REFRESH OPTIVE OP  Place 1 drop into both eyes 2 (two) times daily. Wipe eyes clean with a cleansing pad prior to applying eye droips     vitamin B-12 1000 MCG tablet  Commonly known as:  CYANOCOBALAMIN  Take 1,000 mcg by mouth daily.     VITAMIN D-3 PO  Take 1 capsule by mouth daily.       Allergies  Allergen Reactions  . Glimepiride Other (See Comments)    unknown  . Prednisone Other (See Comments)    increases appetite   Follow-up Information    Follow up with Donzetta Sprung, MD. Schedule an appointment as soon as possible for a visit in 1 week.   Specialty:  Family Medicine   Why:  Hospital follow up   Contact information:   [address] [place] [address] [phone]       Follow up with Peter Swaziland, MD. Schedule an appointment as soon as possible for a visit in 2 weeks.   Specialty:  Cardiology   Why:  Hospital follow up   Contact  information:   [address] [place] [address] [phone]        The results of significant diagnostics from this hospitalization (including imaging, microbiology, ancillary and laboratory) are listed below for reference.    Significant Diagnostic Studies: Ct Head Wo Contrast  03/18/2016  CLINICAL DATA:  82 year old male with fall EXAM: CT HEAD WITHOUT CONTRAST CT CERVICAL SPINE WITHOUT CONTRAST TECHNIQUE: Multidetector CT imaging of the head and cervical spine was performed following the standard protocol without intravenous contrast. Multiplanar CT image reconstructions of the cervical spine were also generated. COMPARISON:  Head CT dated 06/25 7 FINDINGS: CT HEAD FINDINGS Moderate age-related atrophy and chronic microvascular ischemic changes. There is no acute intracranial hemorrhage. No mass effect or midline shift noted. The visualized paranasal sinuses and mastoid air cells are clear. The calvarium is intact. CT CERVICAL SPINE FINDINGS There is no acute fracture or subluxation of the cervical spine.There is osteopenia with extensive multilevel degenerative changes of the spine and facet hypertrophy.The odontoid and spinous processes are intact.There is normal anatomic alignment of the C1-C2 lateral masses. The visualized soft tissues appear unremarkable. Partially visualized bilateral pleural effusions. IMPRESSION: No acute intracranial hemorrhage. No acute/ traumatic cervical spine pathology. Electronically Signed   By: Elgie Collard M.D.   On: 03/18/2016 02:27   Ct Cervical Spine Wo Contrast  03/18/2016  CLINICAL DATA:  82 year old male with fall EXAM: CT HEAD WITHOUT CONTRAST CT CERVICAL SPINE WITHOUT CONTRAST TECHNIQUE: Multidetector CT imaging of the head and cervical spine was performed following the standard protocol without intravenous contrast. Multiplanar CT image reconstructions of the cervical spine were also generated. COMPARISON:  Head CT dated 06/25 7  FINDINGS: CT HEAD FINDINGS Moderate age-related atrophy and chronic microvascular ischemic changes. There is no acute intracranial hemorrhage. No mass effect or midline shift noted. The visualized paranasal sinuses and mastoid air cells are clear. The calvarium is intact. CT CERVICAL SPINE FINDINGS There is no acute fracture or subluxation of the  cervical spine.There is osteopenia with extensive multilevel degenerative changes of the spine and facet hypertrophy.The odontoid and spinous processes are intact.There is normal anatomic alignment of the C1-C2 lateral masses. The visualized soft tissues appear unremarkable. Partially visualized bilateral pleural effusions. IMPRESSION: No acute intracranial hemorrhage. No acute/ traumatic cervical spine pathology. Electronically Signed   By: Elgie Collard M.D.   On: 03/18/2016 02:27   Dg Chest Port 1 View  03/15/2016  CLINICAL DATA:  Pulmonary edema EXAM: PORTABLE CHEST 1 VIEW COMPARISON:  03/14/2016 FINDINGS: Progression of diffuse bilateral airspace disease. Progression of bibasilar airspace disease due to atelectasis and effusion. Prior CABG. IMPRESSION: Worsening bilateral airspace disease consistent with pulmonary edema. Progression of bibasilar atelectasis and bilateral pleural effusions. Electronically Signed   By: Marlan Palau M.D.   On: 03/15/2016 07:27   Dg Chest Port 1 View  03/14/2016  CLINICAL DATA:  Pneumonia EXAM: PORTABLE CHEST 1 VIEW COMPARISON:  03/12/2016 FINDINGS: Progression of diffuse bilateral airspace disease, most prominent in the bases. Progression of small pleural effusions and bibasilar atelectasis. Prior CABG IMPRESSION: Progression of diffuse bilateral airspace disease. This most likely is congestive heart failure with edema. Pneumonia not excluded Progression of bibasilar atelectasis and pleural effusions. Electronically Signed   By: Marlan Palau M.D.   On: 03/14/2016 07:27   Dg Chest Port 1 View  03/12/2016  CLINICAL DATA:   Shortness of breath. EXAM: PORTABLE CHEST 1 VIEW COMPARISON:  Radiograph of March 11, 2016. FINDINGS: Stable cardiomediastinal silhouette. Status post Coronary artery bypass graft. Increased bilateral perihilar and basilar interstitial densities are noted concerning for edema. No definite pneumothorax is noted. Minimal bilateral pleural effusions are noted. Bony thorax is unremarkable. IMPRESSION: Increased bilateral perihilar and basilar interstitial densities are noted concerning for edema with minimal pleural effusions. Electronically Signed   By: Lupita Raider, M.D.   On: 03/12/2016 11:37   Dg Lumbar Spine 2-3vclearing  03/18/2016  CLINICAL DATA:  82 year old male with fall and back pain. EXAM: LIMITED LUMBAR SPINE FOR TRAUMA CLEARING - 2-3 VIEW COMPARISON:  None. FINDINGS: No definite acute fracture or subluxation. There is advanced osteopenia which limits evaluation for fracture. The visualized spinous processes appear intact. There is atherosclerotic calcification of the abdominal aorta. Air-filled stomach and loops of small bowel. IMPRESSION: No definite acute/traumatic lumbar spine pathology. Advanced osteopenia. Electronically Signed   By: Elgie Collard M.D.   On: 03/18/2016 03:32    Microbiology: Recent Results (from the past 240 hour(s))  MRSA PCR Screening     Status: None   Collection Time: 03/12/16 12:49 AM  Result Value Ref Range Status   MRSA by PCR NEGATIVE NEGATIVE Final    Comment:        The GeneXpert MRSA Assay (FDA approved for NASAL specimens only), is one component of a comprehensive MRSA colonization surveillance program. It is not intended to diagnose MRSA infection nor to guide or monitor treatment for MRSA infections.   Culture, blood (routine x 2)     Status: None   Collection Time: 03/12/16  2:32 AM  Result Value Ref Range Status   Specimen Description BLOOD LEFT ANTECUBITAL  Final   Special Requests BOTTLES DRAWN AEROBIC AND ANAEROBIC 10CC   Final    Culture NO GROWTH 5 DAYS  Final   Report Status 03/17/2016 FINAL  Final  Culture, blood (routine x 2)     Status: None   Collection Time: 03/12/16  2:38 AM  Result Value Ref Range Status   Specimen Description  BLOOD RIGHT HAND  Final   Special Requests IN PEDIATRIC BOTTLE 1CC  Final   Culture NO GROWTH 5 DAYS  Final   Report Status 03/17/2016 FINAL  Final     Labs: Basic Metabolic Panel:  Recent Labs Lab 03/14/16 0320 03/15/16 0214 03/16/16 1706 03/17/16 0241 03/18/16 0247 03/19/16 0241  NA 139 136 134* 134* 133* 136  K 2.9* 3.9 3.4* 3.1* 3.4* 3.8  CL 105 103 93* 93* 92* 90*  CO2 26 25 32 32 32 29  GLUCOSE 219* 177* 176* 172* 112* 152*  BUN 41* 28* 15 17 15 17   CREATININE 0.78 0.63 0.87 0.84 0.96 0.93  CALCIUM 8.8* 8.8* 9.2 9.2 9.5 10.5*  MG 2.0  --  1.7 2.1 1.7 2.3   Liver Function Tests:  Recent Labs Lab 03/14/16 0320 03/15/16 0214  AST 42* 41  ALT 35 36  ALKPHOS 56 43  BILITOT 1.0 1.1  PROT 5.4* 4.9*  ALBUMIN 2.6* 2.6*   No results for input(s): LIPASE, AMYLASE in the last 168 hours.  Recent Labs Lab 03/12/16 1250  AMMONIA 30   CBC:  Recent Labs Lab 03/14/16 0320 03/15/16 0214 03/16/16 1706 03/17/16 0241 03/18/16 0247 03/19/16 0241  WBC 12.4* 11.3* 12.8* 13.0* 13.4* 10.5  NEUTROABS 10.9*  --  11.0*  --   --   --   HGB 12.8* 13.5 13.6 12.7* 13.4 12.8*  HCT 38.0* 39.8 39.8 37.4* 39.4 37.8*  MCV 89.8 91.3 90.7 90.1 90.0 90.2  PLT 209 185 214 217 250 266   Cardiac Enzymes:  Recent Labs Lab 03/12/16 1511  TROPONINI 2.27*   BNP: BNP (last 3 results)  Recent Labs  03/12/16 0232  BNP 1688.0*    ProBNP (last 3 results) No results for input(s): PROBNP in the last 8760 hours.  CBG:  Recent Labs Lab 03/18/16 1618 03/18/16 1957 03/19/16 0031 03/19/16 0454 03/19/16 0739  GLUCAP 112* 174* 125* 156* 111*       Signed:  Edsel PetrinMIKHAIL, Khalfani Weideman  [facility] 03/19/2016, 10:45 AM

## 2016-03-19 NOTE — Progress Notes (Signed)
Called report to TroyAllison at [facility] ([facility] wing[facility]

## 2016-03-19 NOTE — Progress Notes (Signed)
Pharmacy Antibiotic Note  Tommy Patterson is a 82 y.o. male  with pneumonia.  Pharmacy has been consulted for vancomycin and Zosyn dosing. Noted plans for 5 days of coverage -WBC= 10.5, afeb, CrCl ~ 40  Plan: -Continue vancomycin 500 mg q 12 hrs (currently day 4) -Consider d/c zosyn (started on 6/26) -Will follow renal function, cultures and clinical progress   Height: 5\' 3"  (160 cm) Weight: 137 lb 5.6 oz (62.3 kg) IBW/kg (Calculated) : 56.9  Temp (24hrs), Avg:97.9 F (36.6 C), Min:97.5 F (36.4 C), Max:98.2 F (36.8 C)   Recent Labs Lab 03/12/16 1250  03/13/16 2140 03/14/16 0027  03/15/16 0214 03/16/16 1706 03/17/16 0241 03/18/16 0247 03/19/16 0241  WBC  --   < >  --   --   < > 11.3* 12.8* 13.0* 13.4* 10.5  CREATININE  --   --   --   --   < > 0.63 0.87 0.84 0.96 0.93  LATICACIDVEN 2.2*  --  1.8 1.6  --   --   --   --   --   --   < > = values in this interval not displayed.  Estimated Creatinine Clearance: 43.3 mL/min (by C-G formula based on Cr of 0.93).    Allergies  Allergen Reactions  . Glimepiride Other (See Comments)    unknown  . Prednisone Other (See Comments)    increases appetite    Antimicrobials this admission: 6/26 vanc >> 6/28 Resume 6/29 > 6/26 zosyn >>  Dose adjustments this admission: n/a  Microbiology results: 6/26 blood x2- neg 6/26 MRSA PCR- neg   Thank you for allowing pharmacy to be a part of this patient's care.  Harland GermanAndrew Haruye Lainez, Pharm D 03/19/2016 9:04 AM

## 2016-03-19 NOTE — Care Management Important Message (Signed)
Important Message  Patient Details  Name: Tommy HaberJohn Oliver Patterson MRN: [id number] Date of Birth: February 17, 1924   Medicare Important Message Given:  Yes    [name] Abena 03/19/2016, 10:46 AM

## 2016-03-19 NOTE — Progress Notes (Signed)
Physical Therapy Treatment Patient Details Name: Tommy Patterson MRN: [id number] DOB: 06-02-1924 Today's Date: 03/19/2016    History of Present Illness Pt is a 82 y/o M who [facility] and found to have troponin of 0.7 consistent with NSTEMI leading to transfer [facility].  His son reports pt has fallen a few times over the past week.  2 weeks ago pt passed dementia test but concern remains for early signs of dementia.  Pt's PMH includes MI, chronic low back pain, CABG x3, Bil carpal tunnel release.    PT Comments    Patient very HOH and requires repetition of verbal commands and at times visual cues. Moving slightly better today.  Follow Up Recommendations  SNF;Supervision/Assistance - 24 hour     Equipment Recommendations  Other (comment) (TBD at next venue of care)    Recommendations for Other Services       Precautions / Restrictions Precautions Precautions: Fall Restrictions Weight Bearing Restrictions: No    Mobility  Bed Mobility Overal bed mobility: Needs Assistance Bed Mobility: Supine to Sit     Supine to sit: Mod assist;HOB elevated     General bed mobility comments: with rail, assist to raise torso  Transfers Overall transfer level: Needs assistance Equipment used: Rolling walker (2 wheeled) Transfers: Sit to/from Stand Sit to Stand: Min assist         General transfer comment: Assist to boost to standing.  Pt demonstrates flexed posture.  Pt was able to correct w/ verbal cues but could not sustain.    Ambulation/Gait Ambulation/Gait assistance: Min assist;+2 safety/equipment Ambulation Distance (Feet): 30 Feet Assistive device: Rolling walker (2 wheeled) Gait Pattern/deviations: Step-through pattern;Decreased stride length;Trunk flexed Gait velocity: very slow and deliberate Gait velocity interpretation: Below normal speed for age/gender General Gait Details: Patient able to manage RW in tight spaces; vc for  upright posture   Stairs            Wheelchair Mobility    Modified Rankin (Stroke Patients Only)       Balance   Sitting-balance support: No upper extremity supported;Feet supported Sitting balance-Leahy Scale: Fair     Standing balance support: Bilateral upper extremity supported Standing balance-Leahy Scale: Poor                      Cognition Arousal/Alertness: Awake/alert Behavior During Therapy: WFL for tasks assessed/performed Overall Cognitive Status: Difficult to assess                      Exercises General Exercises - Lower Extremity Ankle Circles/Pumps: AROM;Both;10 reps Heel Slides: AROM;Both;5 reps Straight Leg Raises: AROM;Both;10 reps    General Comments        Pertinent Vitals/Pain Pain Assessment: No/denies pain    Home Living                      Prior Function            PT Goals (current goals can now be found in the care plan section) Acute Rehab PT Goals Patient Stated Goal: to get stronger and feel better PT Goal Formulation: With patient Time For Goal Achievement: 03/28/16 Potential to Achieve Goals: Good Progress towards PT goals: Progressing toward goals    Frequency  Min 2X/week    PT Plan Current plan remains appropriate    Co-evaluation             End  of Session Equipment Utilized During Treatment: Gait belt Activity Tolerance: Patient tolerated treatment well Patient left: in chair;with call bell/phone within reach;with chair alarm set     Time: 1011-1030 PT Time Calculation (min) (ACUTE ONLY): 19 min  Charges:  $Gait Training: 8-22 mins                    G Codes:      Tommy Patterson 03/19/2016, 11:32 AM Pager [phone]

## 2016-03-19 NOTE — Clinical Social Work Placement (Signed)
   CLINICAL SOCIAL WORK PLACEMENT  NOTE  Date:  03/19/2016  Patient Details  Name: Tommy Patterson MRN: [id number] Date of Birth: [birth year]  Clinical Social Work is seeking post-discharge placement for this patient at the Skilled  Nursing Facility level of care (*CSW will initial, date and re-position this form in  chart as items are completed):  No   Patient/family provided with [facility] Clinical Social Work Department's list of facilities offering this level of care within the geographic area requested by the patient (or if unable, by the patient's family).  No   Patient/family informed of their freedom to choose among providers that offer the needed level of care, that participate in Medicare, Medicaid or managed care program needed by the patient, have an available bed and are willing to accept the patient.  No   Patient/family informed of [facility]'s ownership interest in [facility] and [facility], as well as of the fact that they are under no obligation to receive care at these facilities.  PASRR submitted to EDS on       PASRR number received on       Existing PASRR number confirmed on       FL2 transmitted to all facilities in geographic area requested by pt/family on 03/16/16     FL2 transmitted to all facilities within larger geographic area on       Patient informed that his/her managed care company has contracts with or will negotiate with certain facilities, including the following:        Yes   Patient/family informed of bed offers received.  [facility]     Physician recommends and patient chooses bed at      Patient to be [name] on 03/19/16.  Patient to be transferred to facility by       Patient family notified on 03/19/16 of transfer.  Name of family member notified:  Tommy Patterson     PHYSICIAN Please sign FL2, Please prepare priority  discharge summary, including medications, Please prepare prescriptions     Additional Comment:  Per MD patient is ready to [facility] . RN, patient, patient's family, and facility notified of discharge.CSW spoke with Roberto ScalesPeggy Dalby, Assistant Nursing Director. CSW informed her of patient's sitter. Roberto Scaleseggy Vi was still willing to accept patient. RN given phone number for report and transport packet is on patient's chart. Ambulance transport requested. CSW signing off.   _______________________________________________ Reggy EyeLaShonda A Jiya Kissinger, LCSW 03/19/2016, 12:10 PM

## 2016-03-19 NOTE — Progress Notes (Signed)
OT Cancellation Note  Patient Details Name: Ernest HaberJohn Oliver Gulick MRN: [id number] DOB: [birth year]   Cancelled Treatment:    Reason Eval/Treat Not Completed: Other (comment). [facility] Will defer all further OT services to next venue of care. Please page if needs or situation changes.  Nils PyleJulia Carry Ortez, OTR/L Pager: [phone] 03/19/2016, 11:09 AM

## 2016-03-19 NOTE — Discharge Instructions (Signed)

## 2016-03-22 ENCOUNTER — Ambulatory Visit: Payer: Medicare Other | Admitting: Physician Assistant

## 2016-03-22 DIAGNOSIS — R0989 Other specified symptoms and signs involving the circulatory and respiratory systems: Secondary | ICD-10-CM

## 2016-03-28 ENCOUNTER — Encounter: Payer: Self-pay | Admitting: Physician Assistant

## 2016-04-17 DEATH — deceased

## 2018-01-23 IMAGING — CT CT HEAD W/O CM
3 series · 17 of 37 positions shown, 19 images · non-contrast
Comparison: Head CT dated [DATE] 7

CLINICAL DATA: 89-year-old male with fall

EXAM:
CT HEAD WITHOUT CONTRAST
CT CERVICAL SPINE WITHOUT CONTRAST
TECHNIQUE: Multidetector CT imaging of the head and cervical spine was
performed following the standard protocol without intravenous
contrast. Multiplanar CT image reconstructions of the cervical spine
were also generated.

[Series 2: head without · axial · non-contrast · 0.45mm/px · z∈[-169,-49]mm · 7 of 34 slices shown, 9 images]
[im 5/34  brain]
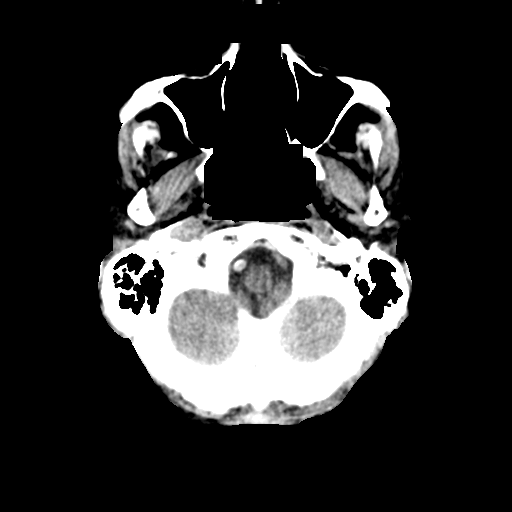
[im 5/34  bone]
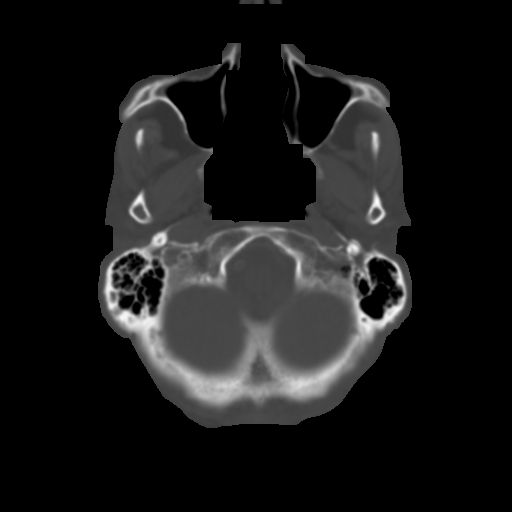
[im 9/34  brain]
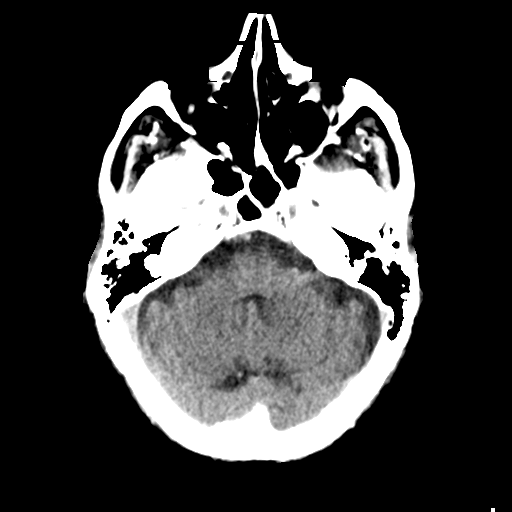
[im 13/34  brain]
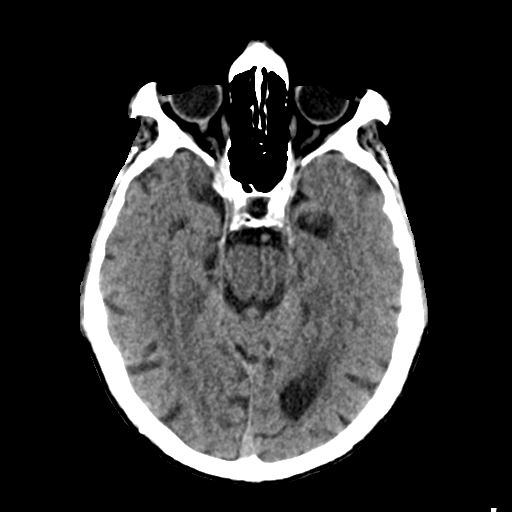
[im 17/34  brain]
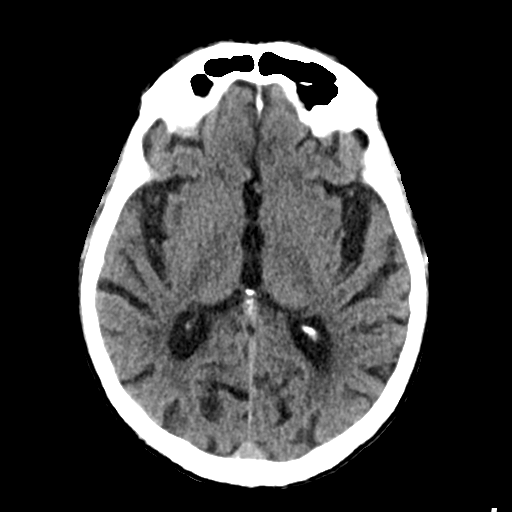
[im 21/34  brain]
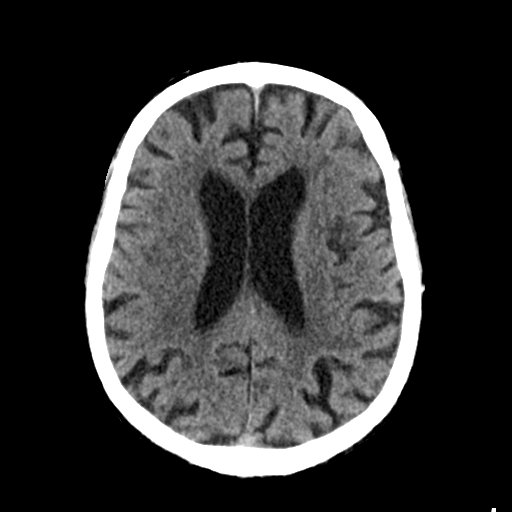
[im 21/34  bone]
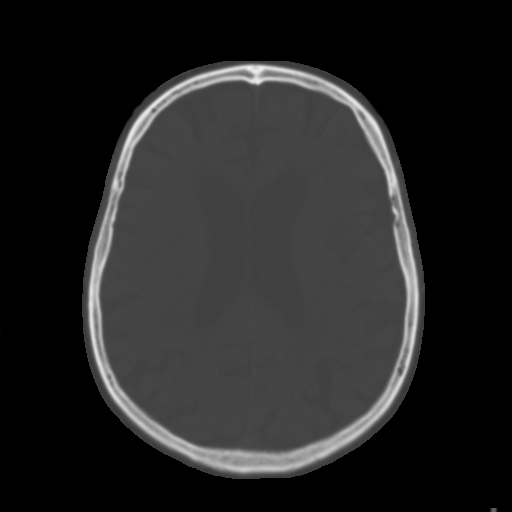
[im 25/34  brain]
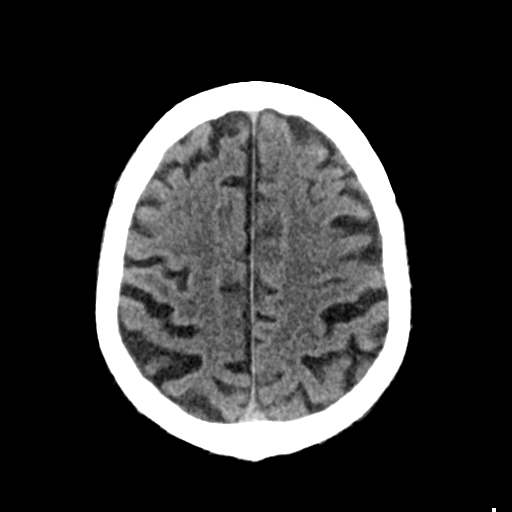
[im 29/34  brain]
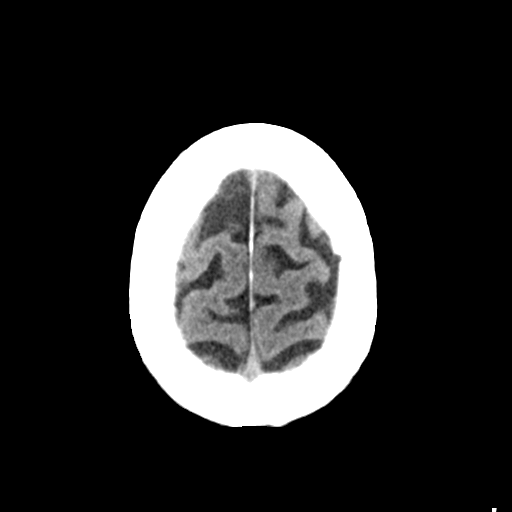

[Series 3: head bone · axial · 0.45mm/px · z∈[-173,-57]mm · 7 of 84 slices shown]
[im 9/84  bone]
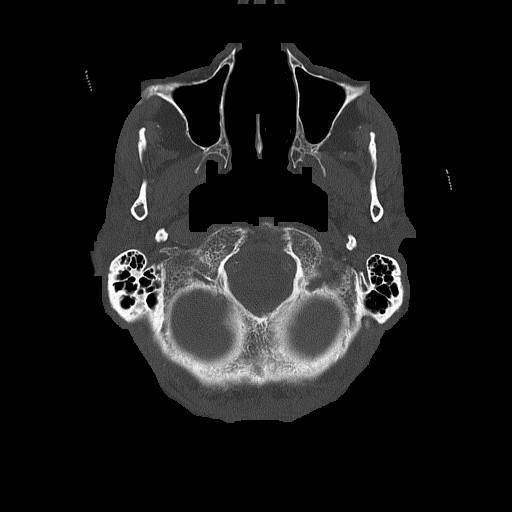
[im 17/84  bone]
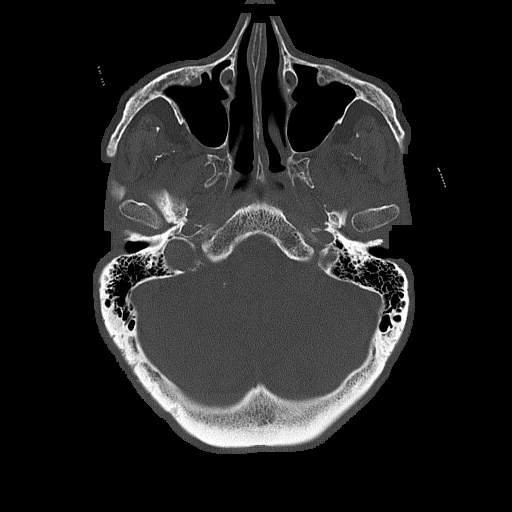
[im 25/84  bone]
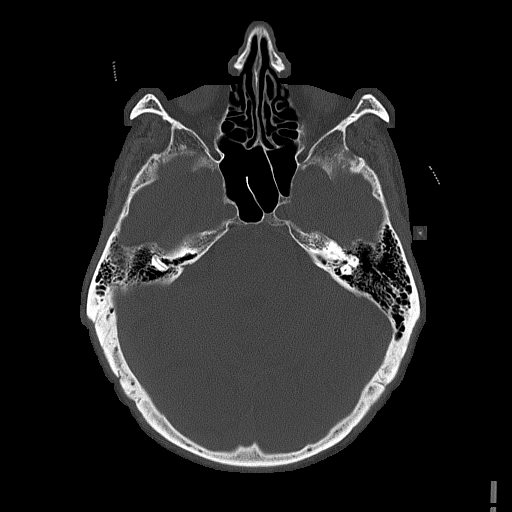
[im 38/84  bone]
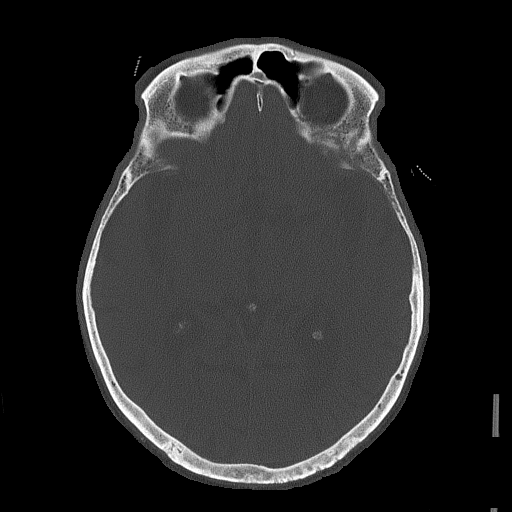
[im 46/84  bone]
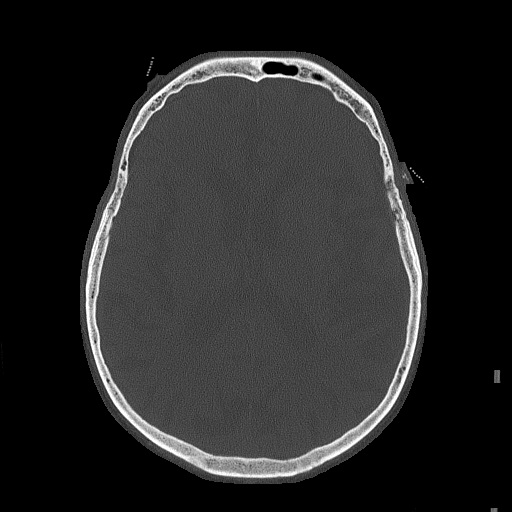
[im 59/84  bone]
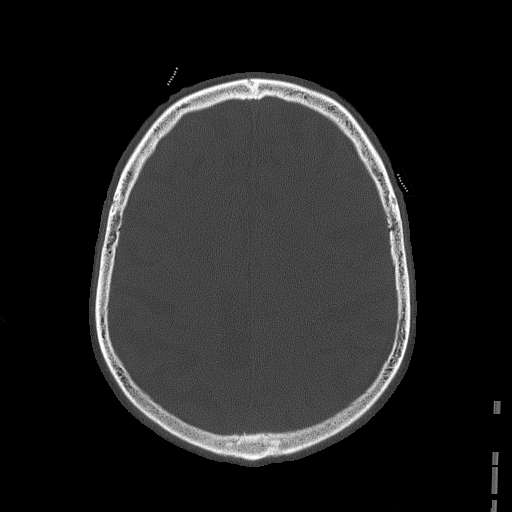
[im 67/84  bone]
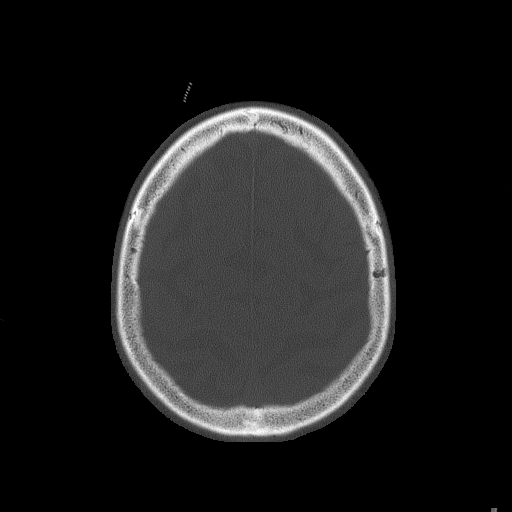

[Series 5: head without sag · sagittal · non-contrast · 0.33mm/px · 3 of 58 slices shown]
[im 20/58  brain]
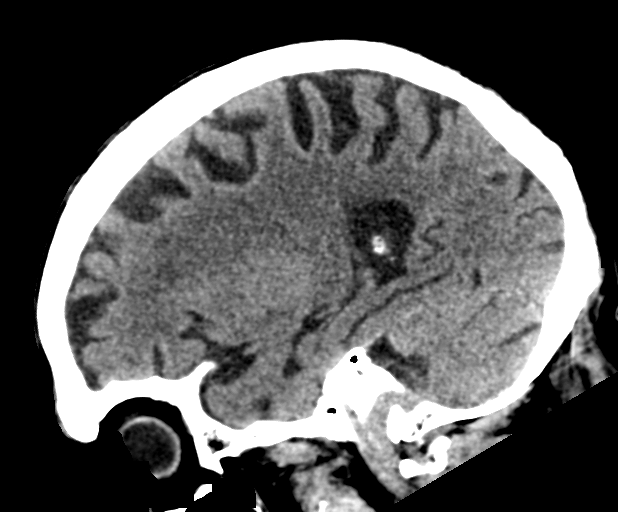
[im 29/58  brain]
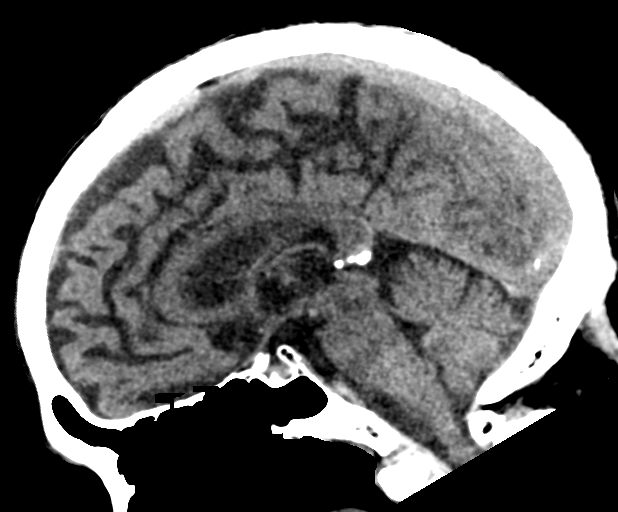
[im 39/58  brain]
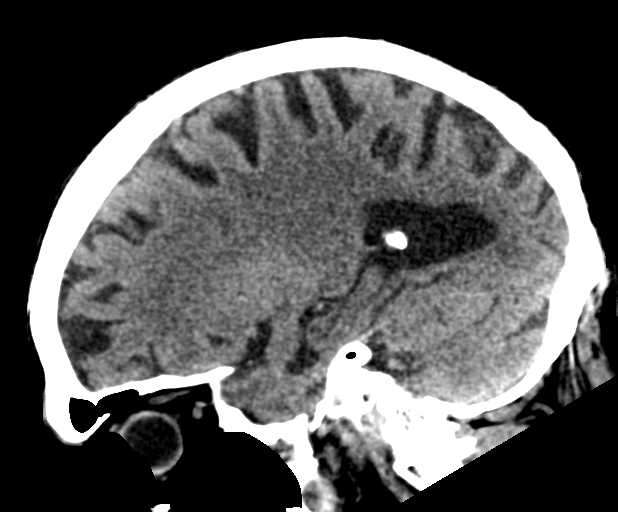

[17 of 37 positions shown; findings below may reference images not displayed]

FINDINGS: CT HEAD FINDINGS

Moderate age-related atrophy and chronic microvascular ischemic
changes. There is no acute intracranial hemorrhage. No mass effect
or midline shift noted.

The visualized paranasal sinuses and mastoid air cells are clear.
The calvarium is intact.

CT CERVICAL SPINE FINDINGS

There is no acute fracture or subluxation of the cervical
spine.There is osteopenia with extensive multilevel degenerative
changes of the spine and facet hypertrophy.The odontoid and spinous
processes are intact.There is normal anatomic alignment of the C1-C2
lateral masses. The visualized soft tissues appear unremarkable.

Partially visualized bilateral pleural effusions.
IMPRESSION: No acute intracranial hemorrhage.

No acute/ traumatic cervical spine pathology.
